# Patient Record
Sex: Male | Born: 1935 | Race: White | Hispanic: No | Marital: Married
Health system: Southern US, Community
[De-identification: ages and names within clinical notes are randomized; demographics above are authoritative.]

## PROBLEM LIST (undated history)

## (undated) DIAGNOSIS — I10 Essential (primary) hypertension: Secondary | ICD-10-CM

## (undated) DIAGNOSIS — I4891 Unspecified atrial fibrillation: Secondary | ICD-10-CM

## (undated) DIAGNOSIS — I509 Heart failure, unspecified: Secondary | ICD-10-CM

## (undated) DIAGNOSIS — R296 Repeated falls: Secondary | ICD-10-CM

## (undated) HISTORY — PX: PACEMAKER PLACEMENT: SHX43

---

## 2003-05-29 ENCOUNTER — Encounter: Admission: RE | Admit: 2003-05-29 | Discharge: 2003-05-29 | Payer: Self-pay | Admitting: Orthopaedic Surgery

## 2004-03-18 ENCOUNTER — Ambulatory Visit: Payer: Self-pay | Admitting: Family Medicine

## 2004-04-06 ENCOUNTER — Inpatient Hospital Stay (HOSPITAL_COMMUNITY): Admission: AD | Admit: 2004-04-06 | Discharge: 2004-04-07 | Payer: Self-pay | Admitting: Orthopaedic Surgery

## 2004-05-09 ENCOUNTER — Ambulatory Visit: Payer: Self-pay | Admitting: Family Medicine

## 2004-05-16 ENCOUNTER — Ambulatory Visit: Payer: Self-pay | Admitting: Family Medicine

## 2004-11-23 ENCOUNTER — Ambulatory Visit: Payer: Self-pay | Admitting: Family Medicine

## 2004-12-09 ENCOUNTER — Ambulatory Visit: Payer: Self-pay | Admitting: Family Medicine

## 2005-01-04 ENCOUNTER — Ambulatory Visit: Payer: Self-pay | Admitting: Family Medicine

## 2005-02-28 ENCOUNTER — Ambulatory Visit: Payer: Self-pay | Admitting: Family Medicine

## 2005-03-06 ENCOUNTER — Ambulatory Visit: Payer: Self-pay | Admitting: Family Medicine

## 2005-03-14 ENCOUNTER — Ambulatory Visit: Payer: Self-pay | Admitting: Family Medicine

## 2005-12-24 IMAGING — CR DG CERVICAL SPINE 2 OR 3 VIEWS
2 series · 2 of 2 positions shown · non-contrast
Comparison: none

CLINICAL DATA: Post-surgical fusion.  
 CERVICAL SPINE ? 2 VIEW:
 The patient has had anterior cervical discectomy and fusion from C-5 to C-7.  Interbody fusion plugs are in place. There is an anterior plate with screw fixation.  No radiographically detectible complication.  There is a drain in place in the left anterior neck.

[view not recorded (1 of 2)]
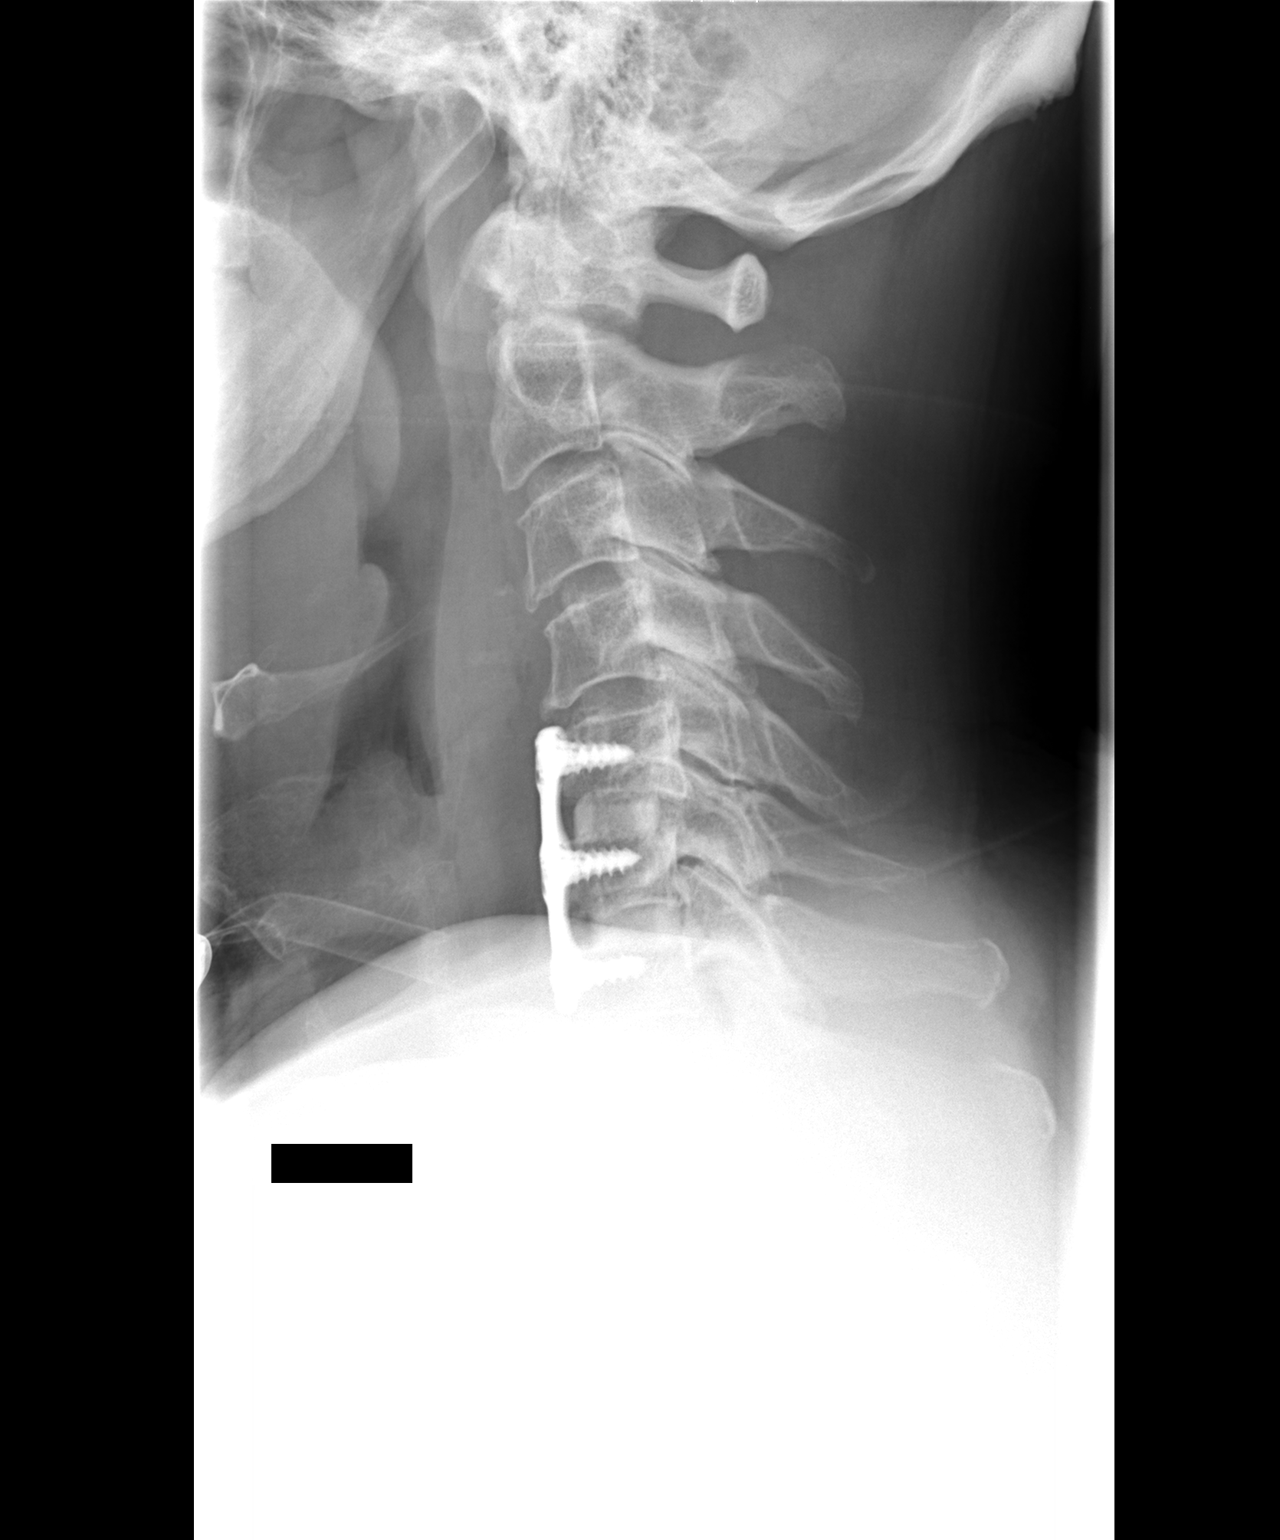

[view not recorded (2 of 2)]
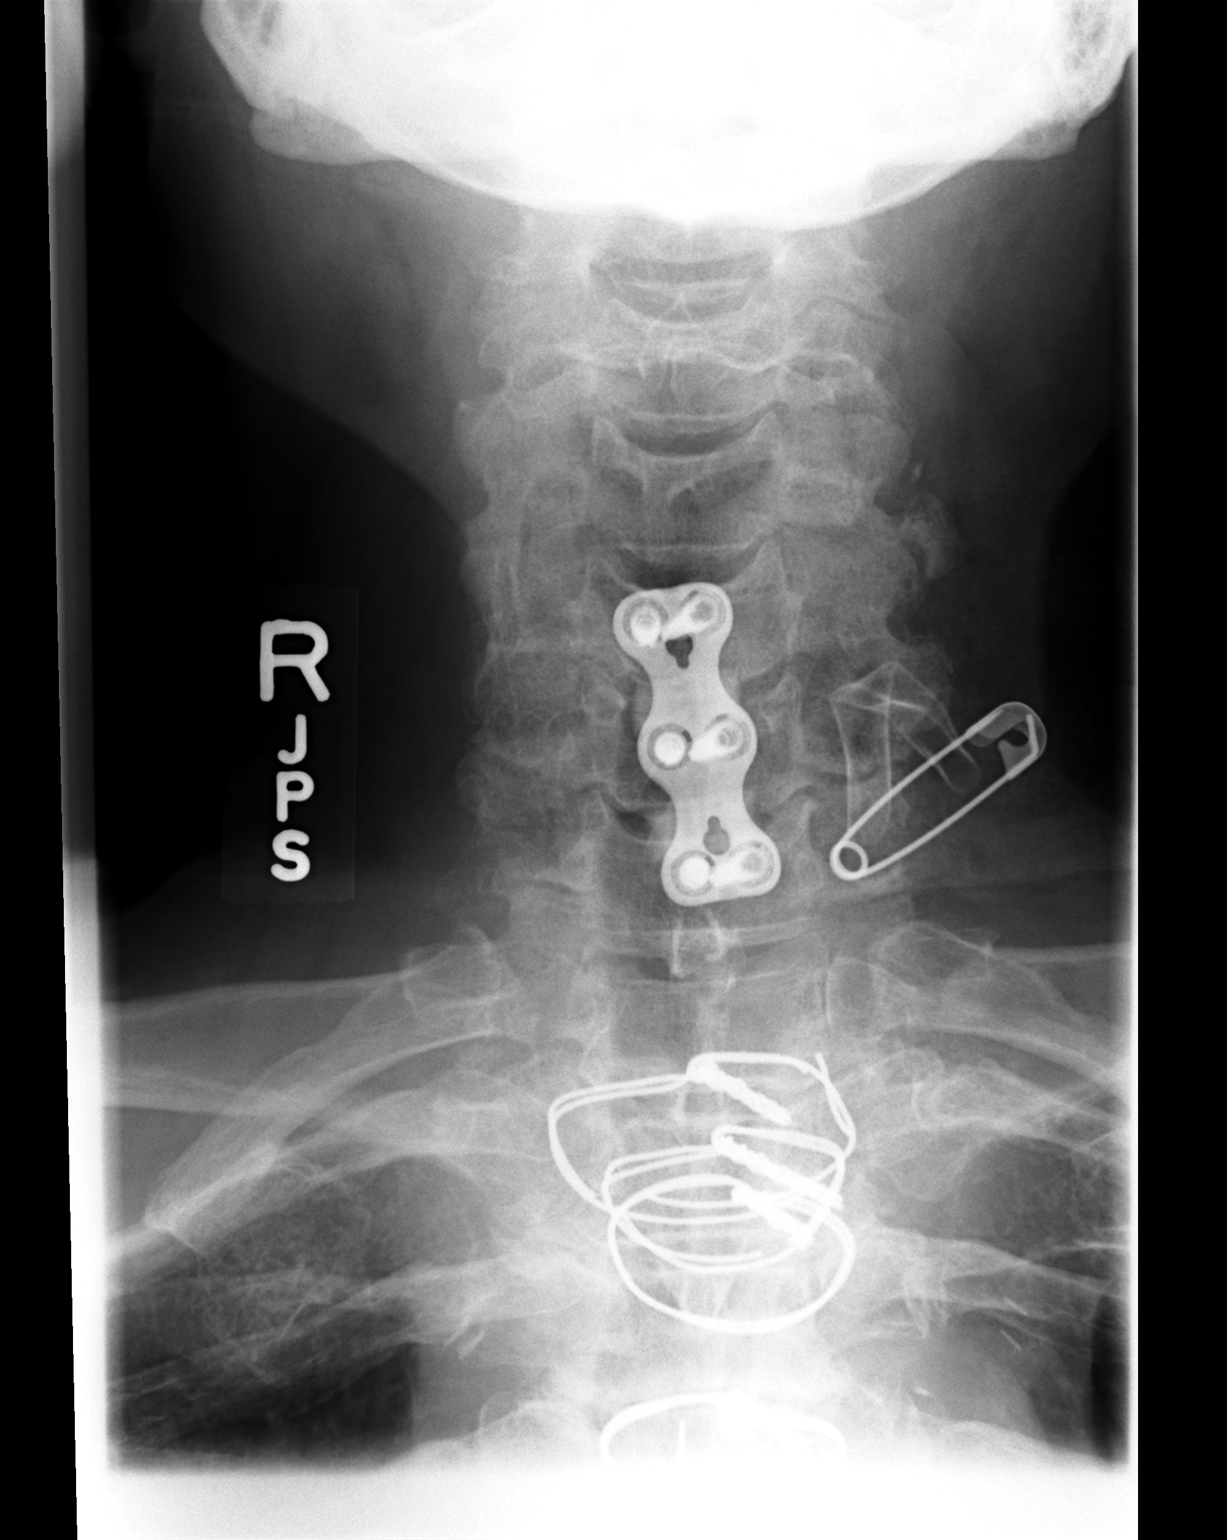

[2 of 2 positions shown; findings below may reference images not displayed]

IMPRESSION: ACDF C-5 to C-7.

## 2010-02-20 ENCOUNTER — Encounter: Payer: Self-pay | Admitting: Orthopaedic Surgery

## 2017-05-03 DIAGNOSIS — Z01818 Encounter for other preprocedural examination: Secondary | ICD-10-CM

## 2017-07-05 ENCOUNTER — Observation Stay (HOSPITAL_COMMUNITY)
Admission: EM | Admit: 2017-07-05 | Discharge: 2017-07-06 | Disposition: A | Discharge status: Home / Self Care | Payer: Medicare HMO | Attending: Surgery | Admitting: Surgery

## 2017-07-05 ENCOUNTER — Encounter (HOSPITAL_COMMUNITY): Payer: Self-pay | Admitting: Emergency Medicine

## 2017-07-05 ENCOUNTER — Other Ambulatory Visit: Payer: Self-pay

## 2017-07-05 DIAGNOSIS — Z9181 History of falling: Secondary | ICD-10-CM | POA: Insufficient documentation

## 2017-07-05 DIAGNOSIS — Z951 Presence of aortocoronary bypass graft: Secondary | ICD-10-CM | POA: Insufficient documentation

## 2017-07-05 DIAGNOSIS — W19XXXA Unspecified fall, initial encounter: Secondary | ICD-10-CM | POA: Diagnosis present

## 2017-07-05 DIAGNOSIS — I10 Essential (primary) hypertension: Secondary | ICD-10-CM | POA: Insufficient documentation

## 2017-07-05 DIAGNOSIS — S32029A Unspecified fracture of second lumbar vertebra, initial encounter for closed fracture: Principal | ICD-10-CM | POA: Insufficient documentation

## 2017-07-05 DIAGNOSIS — R001 Bradycardia, unspecified: Secondary | ICD-10-CM | POA: Insufficient documentation

## 2017-07-05 DIAGNOSIS — I2510 Atherosclerotic heart disease of native coronary artery without angina pectoris: Secondary | ICD-10-CM | POA: Diagnosis not present

## 2017-07-05 DIAGNOSIS — R262 Difficulty in walking, not elsewhere classified: Secondary | ICD-10-CM | POA: Diagnosis not present

## 2017-07-05 DIAGNOSIS — M545 Low back pain: Secondary | ICD-10-CM | POA: Diagnosis not present

## 2017-07-05 DIAGNOSIS — Z79899 Other long term (current) drug therapy: Secondary | ICD-10-CM | POA: Diagnosis not present

## 2017-07-05 DIAGNOSIS — Y92009 Unspecified place in unspecified non-institutional (private) residence as the place of occurrence of the external cause: Secondary | ICD-10-CM | POA: Diagnosis not present

## 2017-07-05 DIAGNOSIS — E7800 Pure hypercholesterolemia, unspecified: Secondary | ICD-10-CM | POA: Diagnosis not present

## 2017-07-05 DIAGNOSIS — R109 Unspecified abdominal pain: Secondary | ICD-10-CM | POA: Diagnosis not present

## 2017-07-05 HISTORY — DX: Essential (primary) hypertension: I10

## 2017-07-05 LAB — CBC
HEMATOCRIT: 39.7 % (ref 39.0–52.0)
HEMOGLOBIN: 13.4 g/dL (ref 13.0–17.0)
MCH: 30.5 pg (ref 26.0–34.0)
MCHC: 33.8 g/dL (ref 30.0–36.0)
MCV: 90.2 fL (ref 78.0–100.0)
Platelets: 183 10*3/uL (ref 150–400)
RBC: 4.4 MIL/uL (ref 4.22–5.81)
RDW: 13.8 % (ref 11.5–15.5)
WBC: 11 10*3/uL — ABNORMAL HIGH (ref 4.0–10.5)

## 2017-07-05 LAB — COMPREHENSIVE METABOLIC PANEL
ALBUMIN: 3.3 g/dL — AB (ref 3.5–5.0)
ALT: 57 U/L (ref 17–63)
AST: 103 U/L — AB (ref 15–41)
Alkaline Phosphatase: 94 U/L (ref 38–126)
Anion gap: 7 (ref 5–15)
BILIRUBIN TOTAL: 1.6 mg/dL — AB (ref 0.3–1.2)
BUN: 12 mg/dL (ref 6–20)
CO2: 26 mmol/L (ref 22–32)
CREATININE: 1.15 mg/dL (ref 0.61–1.24)
Calcium: 9 mg/dL (ref 8.9–10.3)
Chloride: 105 mmol/L (ref 101–111)
GFR calc Af Amer: >60 mL/min (ref >60)
GFR, EST NON AFRICAN AMERICAN: 57 mL/min — AB (ref >60)
GLUCOSE: 125 mg/dL — AB (ref 65–99)
POTASSIUM: 4.1 mmol/L (ref 3.5–5.1)
Sodium: 138 mmol/L (ref 135–145)
TOTAL PROTEIN: 5.7 g/dL — AB (ref 6.5–8.1)

## 2017-07-05 LAB — APTT: aPTT: 29 seconds (ref 24–36)

## 2017-07-05 LAB — PROTIME-INR
INR: 1.08
Prothrombin Time: 13.9 seconds (ref 11.4–15.2)

## 2017-07-05 LAB — MRSA PCR SCREENING: MRSA by PCR: NEGATIVE

## 2017-07-05 MED ORDER — HYDROMORPHONE HCL 1 MG/ML IJ SOLN
1.0000 mg | INTRAMUSCULAR | Status: DC | PRN
  Administered 2017-07-05: 1 mg via INTRAVENOUS
  Filled 2017-07-05 (×2): qty 1

## 2017-07-05 MED ORDER — ENOXAPARIN SODIUM 40 MG/0.4ML ~~LOC~~ SOLN
40.0000 mg | SUBCUTANEOUS | Status: DC
  Administered 2017-07-06: 40 mg via SUBCUTANEOUS
  Filled 2017-07-05: qty 0.4

## 2017-07-05 MED ORDER — FAMOTIDINE IN NACL 20-0.9 MG/50ML-% IV SOLN
20.0000 mg | Freq: Two times a day (BID) | INTRAVENOUS | Status: DC
  Administered 2017-07-05: 20 mg via INTRAVENOUS
  Filled 2017-07-05: qty 50

## 2017-07-05 MED ORDER — HYDROMORPHONE HCL 2 MG/ML IJ SOLN
1.0000 mg | INTRAMUSCULAR | Status: DC | PRN
Start: 2017-07-05 — End: 2017-07-05
  Administered 2017-07-05: 1 mg via INTRAVENOUS
  Filled 2017-07-05: qty 1

## 2017-07-05 MED ORDER — ENOXAPARIN SODIUM 40 MG/0.4ML ~~LOC~~ SOLN
40.0000 mg | SUBCUTANEOUS | Status: DC
  Filled 2017-07-05: qty 0.4

## 2017-07-05 MED ORDER — METOPROLOL TARTRATE 25 MG PO TABS
25.0000 mg | ORAL_TABLET | Freq: Two times a day (BID) | ORAL | Status: DC
  Administered 2017-07-05 – 2017-07-06 (×2): 25 mg via ORAL
  Filled 2017-07-05 (×3): qty 1

## 2017-07-05 MED ORDER — PANTOPRAZOLE SODIUM 40 MG PO TBEC
40.0000 mg | DELAYED_RELEASE_TABLET | Freq: Every day | ORAL | Status: DC
  Administered 2017-07-05 – 2017-07-06 (×2): 40 mg via ORAL
  Filled 2017-07-05 (×2): qty 1

## 2017-07-05 MED ORDER — TRAMADOL HCL 50 MG PO TABS
50.0000 mg | ORAL_TABLET | Freq: Four times a day (QID) | ORAL | Status: DC
  Administered 2017-07-05 – 2017-07-06 (×4): 50 mg via ORAL
  Filled 2017-07-05 (×4): qty 1

## 2017-07-05 MED ORDER — ONDANSETRON 4 MG PO TBDP
4.0000 mg | ORAL_TABLET | Freq: Four times a day (QID) | ORAL | Status: DC | PRN
Start: 2017-07-05 — End: 2017-07-06

## 2017-07-05 MED ORDER — FAMOTIDINE 20 MG PO TABS: 20.0000 mg | ORAL_TABLET | Freq: Two times a day (BID) | ORAL | Status: DC

## 2017-07-05 MED ORDER — METOPROLOL TARTRATE 5 MG/5ML IV SOLN: 5.0000 mg | Freq: Four times a day (QID) | INTRAVENOUS | Status: DC | PRN

## 2017-07-05 MED ORDER — DEXTROSE-NACL 5-0.9 % IV SOLN
INTRAVENOUS | Status: DC
  Administered 2017-07-05 – 2017-07-06 (×4): via INTRAVENOUS

## 2017-07-05 MED ORDER — ACETAMINOPHEN 500 MG PO TABS
1000.0000 mg | ORAL_TABLET | Freq: Three times a day (TID) | ORAL | Status: DC
Start: 2017-07-05 — End: 2017-07-06
  Administered 2017-07-05 – 2017-07-06 (×4): 1000 mg via ORAL
  Filled 2017-07-05 (×4): qty 2

## 2017-07-05 MED ORDER — ONDANSETRON HCL 4 MG/2ML IJ SOLN
4.0000 mg | Freq: Four times a day (QID) | INTRAMUSCULAR | Status: DC | PRN
  Administered 2017-07-05: 4 mg via INTRAVENOUS
  Filled 2017-07-05: qty 2

## 2017-07-05 MED ORDER — METHOCARBAMOL 500 MG PO TABS
750.0000 mg | ORAL_TABLET | Freq: Three times a day (TID) | ORAL | Status: DC
  Administered 2017-07-05 – 2017-07-06 (×5): 750 mg via ORAL
  Filled 2017-07-05 (×5): qty 2

## 2017-07-05 MED ORDER — OXYCODONE HCL 5 MG PO TABS
5.0000 mg | ORAL_TABLET | ORAL | Status: DC | PRN
  Administered 2017-07-06: 5 mg via ORAL
  Filled 2017-07-05: qty 1

## 2017-07-05 MED ORDER — TRAMADOL HCL 50 MG PO TABS
100.0000 mg | ORAL_TABLET | Freq: Four times a day (QID) | ORAL | Status: DC
Start: 2017-07-05 — End: 2017-07-05
  Administered 2017-07-05: 100 mg via ORAL
  Filled 2017-07-05: qty 2

## 2017-07-05 NOTE — Progress Notes (Signed)
CC:  Fall with abdominal and back pain  Subjective: Pt fell at home last evening and [facility] with L2 fracture and hematoma.  He also complains of some abdominal pain, but CT reviewed here by our radiologist and Dr. Luisa Hartornett find no acute issues.  He has some GERD sx at home.  Mostly sore all over this AM.   Objective: Vital signs in last 24 hours: Pulse Rate:  [53-64] 53 (06/06 0630) Resp:  [10-22] 10 (06/06 0630) BP: (102-157)/(51-71) 105/65 (06/06 0630) SpO2:  [90 %-100 %] 90 % (06/06 0630) Weight:  [103 kg (227 lb)] 103 kg (227 lb) (06/06 0028)   NO I/O so far Afebrile, BP is low normal, HR 50's - 60's WBC 11K remainder of CBC OK Glucose 125, T Bil up 1.6, AST 103, low albumin/protein [facility] reviewed by Dr. Luisa Hartornett with radiology, reports not available in Epic at this time.   Intake/Output from previous day: No intake/output data recorded. Intake/Output this shift: No intake/output data recorded.  General appearance: alert, cooperative, no distress and complains of back pain and some abdominal pain. Resp: clear to auscultation bilaterally Chest wall: no tenderness Cardio: regular rhythm, bradycardic - on BB GI: soft, he complains of some tenderness all over, no peritonitis, + BS, last BM yesterday, no distension. Extremities: extremities normal, atraumatic, no cyanosis or edema and he wears a copper compression sleeve over the left knee..  he has neuropathy both feet ongoing for a couple years Pulses: 2+ and symmetric Neurologic: Grossly normal  Lab Results:  Recent Labs    07/05/17 0413  WBC 11.0*  HGB 13.4  HCT 39.7  PLT 183    BMET Recent Labs    07/05/17 0413  NA 138  K 4.1  CL 105  CO2 26  GLUCOSE 125*  BUN 12  CREATININE 1.15  CALCIUM 9.0   PT/INR No results for input(s): LABPROT, INR in the last 72 hours.  Recent Labs  Lab 07/05/17 0413  AST 103*  ALT 57  ALKPHOS 94  BILITOT 1.6*  PROT 5.7*   ALBUMIN 3.3*     Lipase  No results found for: LIPASE   Prior to Admission medications   Medication Sig Start Date End Date Taking? Authorizing Provider  ALPRAZolam (XANAX) 0.25 MG tablet Take 0.25 mg by mouth daily as needed. 05/18/17  Yes [provider]  escitalopram (LEXAPRO) 10 MG tablet Take 10 mg by mouth daily. 01/17/16  Yes [provider]  metoprolol tartrate (LOPRESSOR) 100 MG tablet Take 50 mg by mouth 2 (two) times daily. 05/29/17  Yes [provider]  Multiple Vitamin (MULTIVITAMIN WITH MINERALS) TABS tablet Take 1 tablet by mouth daily.   Yes [provider]  nitroGLYCERIN (NITROSTAT) 0.4 MG SL tablet Place 0.4 mg under the tongue every 5 (five) minutes as needed for chest pain. 06/27/17  Yes [provider]  Propylene Glycol (SYSTANE BALANCE OP) Apply 1 drop to eye as needed (for dry eyes).   Yes [provider]  simvastatin (ZOCOR) 80 MG tablet Take 40 mg by mouth every evening. 05/29/17  Yes [provider]  traMADol (ULTRAM) 50 MG tablet Take 50 mg by mouth daily. 02/24/16  Yes [provider]    Medications: . enoxaparin (LOVENOX) injection  40 mg Subcutaneous Q24H  . pantoprazole  40 mg Oral Daily   . dextrose 5 % and 0.9% NaCl 75 mL/hr at 07/05/17 0136    Assessment/Plan CAD -  CABG 2001 Hypertension Hx of multiple lumbar fusions Hypercholesterolemia Neuropathy secondary to prior back surgeries GERD Occasional Chest pain/GERD - Tums and nitro both resolve issues ?AF - EKG here is reading AF, I think he has P-waves at least in V1, will recheck later on floor. No hx of AF or anticoagulation aside from ASA 81 mg qd - last dose yesterday  Fall over chair Non displaced L2 fracture with small hematoma Aortic changes on CT reviewed - no sign of aortic injury, dissection, or hematoma  FEN:  IV fluids/Clear liquids ID:  None DVT:  Lovenox ordered but does not start till noon Follow up:   TBD  Plan:  I have told him not to eat breakfast till we get neurosurgery to see him.  I will put him back on his BB at a lower dose with sips of water till seen by  Neurosurgery, recheck EKG later on the floor, and if he is in AF contact Cardiology.  He has no hx of AF or anticoagulation.        LOS: 0 days    Parry Po 07/05/2017 [phone]

## 2017-07-05 NOTE — H&P (Signed)
Mitchell Weeks is an 84 y.o. male.   Chief Complaint: Fall over chair HPI: Patient transferred from [facility] after the patient got up and fell over a chair at home.  He presented complaining of low back pain after the fall and some mid abdominal pain.  He denies nausea or vomiting.  He has a history of multiple lumbar spine surgeries for fusions over 10 years ago.  He has some baseline numbness of both feet.  He was evaluated at [facility] and found to have an L2 fracture which was nondisplaced.  His neurological examination was stable.  He also had some changes on CT scan felt to be related to his aorta.  I reviewed the films with the radiologist here at [facility] these changes are quite minimal.  There is no evidence of rupture, dissection or significant hematoma.  There is a small hematoma associate with L2.  Since they have no spine surgeons available they [facility].  Currently, he is complaining of some low back pain.  He denies any significant change to strength to his lower extremities and has baseline numbness present which was present before the fall.  He has mild epigastric abdominal pain currently.  He denies any headache, neck pain, chest pain, or shortness of breath.  He has a history of a CABG in 2001 and takes medication for hypertension and high cholesterol.  His images were not transferred with him.  I reviewed [facility] room.  No past medical history on file. Coronary artery disease  Multiple lumbar fusions  Hypercholesterolemia  Hypertension   No family history on file. Social History:  has no tobacco, alcohol, and drug history on file.  Allergies: Allergies not on file    No results found for this or any previous visit (from the past 48 hour(s)). No results found.  Review of Systems  Constitutional: Negative for chills and fever.  HENT: Negative for hearing loss and tinnitus.   Eyes: Negative for blurred  vision and double vision.  Respiratory: Negative for cough and hemoptysis.   Cardiovascular: Negative for chest pain and palpitations.  Gastrointestinal: Positive for abdominal pain. Negative for nausea and vomiting.  Genitourinary: Negative for dysuria and urgency.  Musculoskeletal: Positive for back pain. Negative for myalgias and neck pain.  Skin: Negative for rash.  Neurological: Negative for dizziness and headaches.  Endo/Heme/Allergies: Does not bruise/bleed easily.  Psychiatric/Behavioral: Negative for depression and suicidal ideas.    Blood pressure (!) 146/71, pulse 64, resp. rate 15, height 5\' 11"  (1.803 m), weight 103 kg (227 lb), SpO2 100 %. Physical Exam  Constitutional: He is oriented to person, place, and time. He appears well-developed and well-nourished.  HENT:  Head: Normocephalic and atraumatic.  Eyes: Pupils are equal, round, and reactive to light.  Neck: Normal range of motion. Neck supple.  Nontender  Cardiovascular: Normal rate and regular rhythm.  Respiratory: Effort normal and breath sounds normal. No respiratory distress.  GI: Soft. He exhibits no mass. There is tenderness. There is no rebound and no guarding.  Genitourinary: Penis normal.  Musculoskeletal:       Lumbar back: He exhibits tenderness.  Neurological: He is alert and oriented to person, place, and time. He has normal strength. No cranial nerve deficit or sensory deficit. GCS eye subscore is 4. GCS verbal subscore is 5. GCS motor subscore is 6.  Patient has baseline numbness from previous spine surgeries in the past.  He states he is no  different than baseline.  Motor function of bilateral lower extremities normal.  Skin: Skin is warm and dry.     Assessment/Plan Fall  Nondisplaced L2 fracture-neurologically intact.  Will obtain neurosurgery consultation later on this morning.  Admit and place on logroll only for now.  Aortic changes on CT-I reviewed these with the radiologist here at [facility].  These seem quite minimal.  Repeat CT scan in 24 to 48 hours to reevaluate but currently no signs of any aortic injury, dissection, or significant hematoma.  He does have atherosclerotic disease.  [facility] spoke to the vascular surgeon on-call Dr. Durene Cal III.  Dortha Schwalbe, MD 07/05/2017, 12:48 AM

## 2017-07-05 NOTE — Consult Note (Signed)
[facility]ist of [place]  Patient name: Mitchell Weeks MRN: [id number] DOB: 1932-03-14 Sex: male   REQUESTING PROVIDER:    [facility]   REASON FOR CONSULT:    ? Aortic injury  HISTORY OF PRESENT ILLNESS:   Mitchell Weeks is a 84 y.o. male, who presented to the [facility] emergency department after he fell over a chair at home.  He was complaining of low back pain as well as some mild abdominal pain.  He had a CT scan which suggested a possible aortic injury.  He also had a nondisplaced L2 fracture.  He was transferred to [facility] for further evaluation.  He has remained hemodynamically stable and neurologically intact.  His pain is about the same as mostly in his back.  Patient is status post CABG in 2001.  He is medically managed for hypertension hypercholesterolemia.  PAST MEDICAL HISTORY    Past Medical History:  Diagnosis Date  . Hypertension      FAMILY HISTORY   No family history on file.  SOCIAL HISTORY:   Social History   Socioeconomic History  . Marital status: Married    Spouse name: Not on file  . Number of children: Not on file  . Years of education: Not on file  . Highest education level: Not on file  Occupational History  . Not on file  Social Needs  . Financial resource strain: Not on file  . Food insecurity:    Worry: Not on file    Inability: Not on file  . Transportation needs:    Medical: Not on file    Non-medical: Not on file  Tobacco Use  . Smoking status: Not on file  Substance and Sexual Activity  . Alcohol use: Not on file  . Drug use: Not on file  . Sexual activity: Not on file  Lifestyle  . Physical activity:    Days per week: Not on file    Minutes per session: Not on file  . Stress: Not on file  Relationships  . Social connections:    Talks on phone: Not on file    Gets together: Not on file    Attends religious service: Not on file    Active member of club or  organization: Not on file    Attends meetings of clubs or organizations: Not on file    Relationship status: Not on file  . Intimate partner violence:    Fear of current or ex partner: Not on file    Emotionally abused: Not on file    Physically abused: Not on file    Forced sexual activity: Not on file  Other Topics Concern  . Not on file  Social History Narrative  . Not on file    ALLERGIES:    Allergies  Allergen Reactions  . Niacin Other (See Comments)    Flushing (ALLERGY/intolerance)    CURRENT MEDICATIONS:    Current Facility-Administered Medications  Medication Dose Route Frequency Provider Last Rate Last Dose  . acetaminophen (TYLENOL) tablet 1,000 mg  1,000 mg Oral Q8H Sherrie George, PA-C      . dextrose 5 %-0.9 % sodium chloride infusion   Intravenous Continuous Harriette Bouillon, MD 75 mL/hr at 07/05/17 0136    . [START ON 07/06/2017] enoxaparin (LOVENOX) injection 40 mg  40 mg Subcutaneous Q24H Almond Lint, MD      . famotidine (PEPCID) tablet 20 mg  20 mg Oral BID Sherrie George, PA-C      .  HYDROmorphone (DILAUDID) injection 1 mg  1 mg Intravenous Q2H PRN Bertram MillardMaccia, Michael A, RPH      . methocarbamol (ROBAXIN) tablet 750 mg  750 mg Oral TID Sherrie GeorgeJennings, Willard, PA-C      . metoprolol tartrate (LOPRESSOR) tablet 25 mg  25 mg Oral BID Sherrie GeorgeJennings, Willard, PA-C   25 mg at 07/05/17 0900  . ondansetron (ZOFRAN-ODT) disintegrating tablet 4 mg  4 mg Oral Q6H PRN Cornett, Thomas, MD       Or  . ondansetron (ZOFRAN) injection 4 mg  4 mg Intravenous Q6H PRN Harriette Bouillonornett, Thomas, MD   4 mg at 07/05/17 0129  . oxyCODONE (Oxy IR/ROXICODONE) immediate release tablet 5-10 mg  5-10 mg Oral Q4H PRN Sherrie GeorgeJennings, Willard, PA-C      . pantoprazole (PROTONIX) EC tablet 40 mg  40 mg Oral Daily Cornett, Thomas, MD   40 mg at 07/05/17 0900  . traMADol (ULTRAM) tablet 100 mg  100 mg Oral Q6H Sherrie GeorgeJennings, Willard, PA-C        REVIEW OF SYSTEMS:   [X]  denotes positive finding, [ ]  denotes  negative finding Cardiac  Comments:  Chest pain or chest pressure:    Shortness of breath upon exertion:    Short of breath when lying flat:    Irregular heart rhythm:        Vascular    Pain in calf, thigh, or hip brought on by ambulation:    Pain in feet at night that wakes you up from your sleep:     Blood clot in your veins:    Leg swelling:         Pulmonary    Oxygen at home:    Productive cough:     Wheezing:         Neurologic    Sudden weakness in arms or legs:     Sudden numbness in arms or legs:     Sudden onset of difficulty speaking or slurred speech:    Temporary loss of vision in one eye:     Problems with dizziness:         Gastrointestinal    Blood in stool:      Vomited blood:         Genitourinary    Burning when urinating:     Blood in urine:        Psychiatric    Major depression:         Hematologic    Bleeding problems:    Problems with blood clotting too easily:        Skin    Rashes or ulcers:        Constitutional    Fever or chills:     PHYSICAL EXAM:   Vitals:   07/05/17 0715 07/05/17 0730 07/05/17 0745 07/05/17 0900  BP: 114/60 112/72 126/89 119/61  Pulse: (!) 29 (!) 56 64 62  Resp: 10 11 15 16   SpO2: 93% 93% 97% 96%  Weight:      Height:        GENERAL: The patient is a well-nourished male, in no acute distress. The vital signs are documented above. CARDIAC: There is a regular rate and rhythm.  PULMONARY: Nonlabored respirations ABDOMEN: Soft and mildly tender.  MUSCULOSKELETAL: There are no major deformities or cyanosis. NEUROLOGIC: No focal weakness or paresthesias are detected. SKIN: There are no ulcers or rashes noted. PSYCHIATRIC: The patient has a normal affect.  STUDIES:   I have reviewed his CT scan which  showed some periaortic blood.  It is unknown wheather this is from his nondisplaced L2 fracture or truly injury to the aorta  ASSESSMENT and PLAN    possible aortic injury: My suspicion is very low that  this Is a significant aortic injury however, I think he needs to be monitored.  Continue with blood pressure control and plans for repeat CT scan tomorrow.    Durene Cal, MD [facility] [phone] Pager [phone]

## 2017-07-05 NOTE — ED Notes (Signed)
Report given to [address].

## 2017-07-05 NOTE — ED Notes (Signed)
Neuro surgery at bedside.

## 2017-07-05 NOTE — Progress Notes (Signed)
Patient NPO  Lunch ordered for patient spouse

## 2017-07-05 NOTE — ED Triage Notes (Signed)
Pt from home. Transfer from [facility]. Fall @ approximately 4:30 or 5p. Hit head. Rates pain @ 10/10. Per [facility] pt has an L2 fx and aortic hematoma. PMS intact. A&O x4.

## 2017-07-05 NOTE — Progress Notes (Signed)
Orthopedic Tech Progress Note Patient Details:  Mitchell Weeks 1932/10/27 [id number]  Patient ID: Mitchell Weeks, male   DOB: 1932/10/27, 84 y.o.   MRN: [id number]   Mitchell HollerJennifer C KoontzCalled [employer] for Clamshell TLSO brace. 07/05/2017, 10:10 AM

## 2017-07-05 NOTE — ED Notes (Signed)
Surgery at bedside.

## 2017-07-05 NOTE — ED Notes (Signed)
Dr. Luisa Hartornett of trauma aware patient has arrived

## 2017-07-05 NOTE — Evaluation (Signed)
Occupational Therapy Evaluation Patient Details Name: Mitchell Weeks MRN: [id number] DOB: 1932/07/01 Today's Date: 07/05/2017    History of Present Illness The patient is an 84 year old white male with multiple medical problems including legal blindness, Patient is status post CABG in 2001.  He is medically managed for hypertension hypercholesterolemia.  He has had several spine surgeries by Dr. Lucius Conn, and orthopedic surgeon in [place].  His most recent surgery was approximately 4 months ago. The patient took a fall yesterday onset of  back pain. CT of the abdomen pelvis demonstrated a L2 fracture.  He has chronic numbness in his feet. Vascular surgery is also following monitoring his aorta for bleeding.     Clinical Impression   PTA Pt got assist from wife for LB dressing tasks, otherwise independent in ADL, used a SPC for mobility - but with history of falls. Pt is currently min A +2 for transfers with RW (anticipate +1 by tomorrow), max A to don brace (education provided), max A for LB dressing, and min guard for sink level ADL. Pt will benefit from skilled OT in the acute setting with tomorrow's focus on AE education, continued brace education, and tub transfer    Follow Up Recommendations  Supervision/Assistance - 24 hour    Equipment Recommendations  Tub/shower seat    Recommendations for Other Services       Precautions / Restrictions Precautions Precautions: Back Precaution Comments: verbally reviewed with pt and wife.  Remembered from previous surgery.  Required Braces or Orthoses: Spinal Brace Spinal Brace: Thoracolumbosacral orthotic;Applied in sitting position      Mobility Bed Mobility Overal bed mobility: Needs Assistance Bed Mobility: Sit to Sidelying;Rolling;Sidelying to Sit Rolling: Min guard Sidelying to sit: Min guard     Sit to sidelying: Min guard General bed mobility comments: Min guard assist to ensure back precautions and task  completion  Transfers Overall transfer level: Needs assistance Equipment used: Rolling walker (2 wheeled) Transfers: Sit to/from Stand Sit to Stand: Min assist;+2 safety/equipment         General transfer comment: Min assist to support trunk during transitions.  Verbal cues for safe hand placement and safe RW use during transitions as well as to ensure he feels the bed on the back of his legs prior to sitting down.     Balance Overall balance assessment: Needs assistance Sitting-balance support: Feet supported;Bilateral upper extremity supported Sitting balance-Leahy Scale: Fair     Standing balance support: Bilateral upper extremity supported Standing balance-Leahy Scale: Poor                             ADL either performed or assessed with clinical judgement   ADL Overall ADL's : Needs assistance/impaired Eating/Feeding: Independent   Grooming: Min guard;Standing   Upper Body Bathing: Moderate assistance   Lower Body Bathing: Maximal assistance;With caregiver independent assisting   Upper Body Dressing : Maximal assistance;Sitting Upper Body Dressing Details (indicate cue type and reason): to don brace - education complete Lower Body Dressing: Maximal assistance;With caregiver independent assisting;Sit to/from stand   Toilet Transfer: Min guard;Ambulation;RW;Comfort height toilet;Grab bars   Toileting- Clothing Manipulation and Hygiene: Maximal assistance;Sit to/from stand       Functional mobility during ADLs: Min guard;Rolling walker General ADL Comments: Pt educated in BLT back precautions - NO AE education this session     Vision Baseline Vision/History: Legally blind;Glaucoma Patient Visual Report: No change from baseline Additional Comments: legally blind  Perception     Praxis      Pertinent Vitals/Pain Pain Assessment: 0-10 Pain Score: 8  Pain Location: lumber pain Pain Descriptors / Indicators: Constant;Discomfort;Grimacing Pain  Intervention(s): Limited activity within patient's tolerance;Monitored during session;Repositioned     Hand Dominance Right   Extremity/Trunk Assessment Upper Extremity Assessment Upper Extremity Assessment: Overall WFL for tasks assessed   Lower Extremity Assessment Lower Extremity Assessment: Defer to PT evaluation RLE Deficits / Details: generalized weakness, no new numbness, tingling or burning.  RLE Sensation: history of peripheral neuropathy LLE Deficits / Details: generalized weakness, no new numbness, tingling or burning.  LLE Sensation: history of peripheral neuropathy   Cervical / Trunk Assessment Cervical / Trunk Assessment: Other exceptions Cervical / Trunk Exceptions: h/o lumbar spine surgery.   Communication Communication Communication: No difficulties   Cognition Arousal/Alertness: Awake/alert Behavior During Therapy: WFL for tasks assessed/performed Overall Cognitive Status: Within Functional Limits for tasks assessed                                     General Comments  wife present throughout session    Exercises     Shoulder Instructions      Home Living Family/patient expects to be discharged to:: Private residence Living Arrangements: Spouse/significant other Available Help at Discharge: Family;Available 24 hours/day Type of Home: Apartment Home Access: Stairs to enter Entrance Stairs-Number of Steps: 5 Entrance Stairs-Rails: Can reach both;Right;Left Home Layout: One level     Bathroom Shower/Tub: Chief Strategy OfficerTub/shower unit   Bathroom Toilet: Handicapped height Bathroom Accessibility: No   Home Equipment: Cane - single point;Walker - 4 wheels;Grab bars - tub/shower          Prior Functioning/Environment Level of Independence: Needs assistance  Gait / Transfers Assistance Needed: SPC for ambulation ADL's / Homemaking Assistance Needed: assist for LB dressing            OT Problem List: Decreased activity tolerance;Impaired  balance (sitting and/or standing);Decreased knowledge of use of DME or AE;Decreased knowledge of precautions;Cardiopulmonary status limiting activity;Pain      OT Treatment/Interventions: Self-care/ADL training;DME and/or AE instruction;Therapeutic activities;Patient/family education;Balance training    OT Goals(Current goals can be found in the care plan section) Acute Rehab OT Goals Patient Stated Goal: to stop falling so much OT Goal Formulation: With patient Time For Goal Achievement: 07/19/17 Potential to Achieve Goals: Good ADL Goals Pt Will Transfer to Toilet: with supervision;ambulating Pt Will Perform Tub/Shower Transfer: Tub transfer;with min guard assist;with caregiver independent in assisting;anterior/posterior transfer;rolling walker Additional ADL Goal #1: Pt will verbalize understanding of AE to assist with LB ADL at teach back level Additional ADL Goal #2: Pt will don/doff brace with assist from caregiver at supervision level  OT Frequency: Min 2X/week   Barriers to D/C:            Co-evaluation PT/OT/SLP Co-Evaluation/Treatment: Yes Reason for Co-Treatment: Other (comment)(due to imminent dc order) PT goals addressed during session: Mobility/safety with mobility OT goals addressed during session: ADL's and self-care;Proper use of Adaptive equipment and DME      AM-PAC PT "6 Clicks" Daily Activity     Outcome Measure Help from another person eating meals?: None Help from another person taking care of personal grooming?: A Little Help from another person toileting, which includes using toliet, bedpan, or urinal?: A Lot Help from another person bathing (including washing, rinsing, drying)?: A Lot Help from another person to put on and  taking off regular upper body clothing?: A Lot Help from another person to put on and taking off regular lower body clothing?: A Lot 6 Click Score: 15   End of Session Equipment Utilized During Treatment: Rolling walker;Back  brace Nurse Communication: Mobility status;Precautions  Activity Tolerance: Patient tolerated treatment well Patient left: in bed;with call bell/phone within reach;with family/visitor present  OT Visit Diagnosis: Unsteadiness on feet (R26.81);Other abnormalities of gait and mobility (R26.89);History of falling (Z91.81);Pain Pain - Right/Left: (central) Pain - part of body: (lumbar spine)                Time: 8295-6213 OT Time Calculation (min): 23 min Charges:  OT General Charges $OT Visit: 1 Visit OT Evaluation $OT Eval Moderate Complexity: 1 Mod G-Codes:     Sherryl Manges OTR/L [phone]  Evern Bio Vedanth Sirico 07/05/2017, 5:38 PM

## 2017-07-05 NOTE — Consult Note (Signed)
Reason for Consult: L2 fracture Referring Physician: Trauma surgery  Mitchell Weeks is an 84 y.o. male.  HPI: The patient is an 84 year old white male with multiple medical problems including legal blindness.  He has had several spine surgeries by Dr. Ian Malkin, and orthopedic surgeon in [place].  His most recent surgery was approximately 4 months ago.  The patient took a fall yesterday onset of  back pain.  He was taken to [facility] and a CT of the abdomen pelvis demonstrated a L2 fracture.  Was also some concern about his aorta.  He was transferred to the trauma service at [facility]  A neurosurgical consultation is requested.  Presently the patient is alert and pleasant.  He planes of lumbar spine pain.  He has chronic numbness in his feet which has not changed.  He denies weakness.  He has some chronic mild neck pain which has not changed since his fall.  Past Medical History:  Diagnosis Date  . Hypertension       No family history on file.  Social History:  has no tobacco, alcohol, and drug history on file.  Allergies:  Allergies  Allergen Reactions  . Niacin Other (See Comments)    Flushing (ALLERGY/intolerance)    Medications:  I have reviewed the patient's current medications. Prior to Admission:  (Not in a hospital admission) Scheduled: . [START ON 07/06/2017] enoxaparin (LOVENOX) injection  40 mg Subcutaneous Q24H  . metoprolol tartrate  25 mg Oral BID  . pantoprazole  40 mg Oral Daily   Continuous: . dextrose 5 % and 0.9% NaCl 75 mL/hr at 07/05/17 0136  . famotidine (PEPCID) IV 20 mg (07/05/17 0904)   KXF:GHWEXHBZJIRCV (DILAUDID) injection, ondansetron **OR** ondansetron (ZOFRAN) IV Anti-infectives (From admission, onward)   None       Results for orders placed or performed during the hospital encounter of 07/05/17 (from the past 48 hour(s))  CBC     Status: Abnormal   Collection Time: 07/05/17  4:13 AM  Result Value Ref Range   WBC 11.0  (H) 4.0 - 10.5 K/uL   RBC 4.40 4.22 - 5.81 MIL/uL   Hemoglobin 13.4 13.0 - 17.0 g/dL   HCT 39.7 39.0 - 52.0 %   MCV 90.2 78.0 - 100.0 fL   MCH 30.5 26.0 - 34.0 pg   MCHC 33.8 30.0 - 36.0 g/dL   RDW 13.8 11.5 - 15.5 %   Platelets 183 150 - 400 K/uL    Comment: Performed at [facility], [place] [address] [place], [place] [address]  Comprehensive metabolic panel     Status: Abnormal   Collection Time: 07/05/17  4:13 AM  Result Value Ref Range   Sodium 138 135 - 145 mmol/L   Potassium 4.1 3.5 - 5.1 mmol/L   Chloride 105 101 - 111 mmol/L   CO2 26 22 - 32 mmol/L   Glucose, Bld 125 (H) 65 - 99 mg/dL   BUN 12 6 - 20 mg/dL   Creatinine, Ser 1.15 0.61 - 1.24 mg/dL   Calcium 9.0 8.9 - 10.3 mg/dL   Total Protein 5.7 (L) 6.5 - 8.1 g/dL   Albumin 3.3 (L) 3.5 - 5.0 g/dL   AST 103 (H) 15 - 41 U/L   ALT 57 17 - 63 U/L   Alkaline Phosphatase 94 38 - 126 U/L   Total Bilirubin 1.6 (H) 0.3 - 1.2 mg/dL   GFR calc non Af Amer 57 (L) >60 mL/min   GFR calc  Af Amer >60 >60 mL/min    Comment: (NOTE) The eGFR has been calculated using the CKD EPI equation. This calculation has not been validated in all clinical situations. eGFR's persistently <60 mL/min signify possible Chronic Kidney Disease.    Anion gap 7 5 - 15    Comment: Performed at [facility] [address] [place], [place] [address]  Protime-INR     Status: None   Collection Time: 07/05/17  8:37 AM  Result Value Ref Range   Prothrombin Time 13.9 11.4 - 15.2 seconds   INR 1.08     Comment: Performed at [facility] [address], [place], [place] [address]  APTT     Status: None   Collection Time: 07/05/17  8:37 AM  Result Value Ref Range   aPTT 29 24 - 36 seconds    Comment: Performed at [facility] [address] [place], [place] [address]    No results found.  ROS: As above Blood pressure 119/61, pulse 62, resp. rate 16, height 5' 11"  (1.803 m), weight 103 kg (227 lb), SpO2 96 %. Estimated body  mass index is 31.66 kg/m as calculated from the following:   Height as of this encounter: 5' 11"  (1.803 m).   Weight as of this encounter: 103 kg (227 lb).  Physical Exam  General: An alert and pleasant 84 year old white male in no apparent distress laying on a stretcher.  HEENT: Normocephalic, atraumatic, his pupils are equal and round.  Extraocular muscles are intact  Neck: Supple without obvious deformity.  He has limited cervical range of motion.  Thorax: Symmetric  Abdomen: Obese  Back exam: He is tender at approximately L2.  Neurologic exam: The patient is alert and oriented x3.  Cranial nerves II through XII were grossly normal except as above he is legally blind secondary to macular degeneration.  The patient's motor strength is 5/5 in his bowel bicep, triceps, handgrip, quadriceps, gastrocnemius and dorsiflexors.  Sensory function demonstrates distal chronic numbness in his feet.  Cerebellar function is intact to rapid alternating movements of the upper extremities bilaterally.  Imaging studies: I have reviewed the patient's CT of the abdomen pelvis only as it pertains to his spine.  He has had pedicle screw instrumentation and fusion at L4-5 and L5-S1.  He has diffuse degenerative changes.  He has a bony chance type fracture through the L2 vertebrae which is not displaced.  Assessment/Plan: L2 fracture: This will likely heal well in a TLSO.  The patient has an established Licensed conveyancer in his hometown who has recently done surgery on him.  He can follow-up with him after discharge.  Please call if I can be of further assistance.  Ophelia Charter 07/05/2017, 9:25 AM

## 2017-07-05 NOTE — Evaluation (Signed)
Physical Therapy Evaluation Patient Details Name: Mitchell Weeks MRN: [id number] DOB: [birth year] Today's Date: 07/05/2017   History of Present Illness  The patient is an 84 year old white male with multiple medical problems including legal blindness, Patient is status post CABG in 2001.  He is medically managed for hypertension hypercholesterolemia.  He has had several spine surgeries by Dr. Lucius Connurani, and orthopedic surgeon in [place].  His most recent surgery was approximately 4 months ago. The patient took a fall yesterday onset of  back pain. CT of the abdomen pelvis demonstrated a L2 fracture.  He has chronic numbness in his feet. Vascular surgery is also following monitoring his aorta for bleeding.    Clinical Impression  Pt was able to get up with therapists and walk the hallways with min assist overall.  TLSO donned EOB and back precautions reviewed (bring back handout tomorrow). Pt will benefit from stair training prior to d/c.  He is familiar with back precautions from previous lumbar surgery.   PT to follow acutely for deficits listed below.       Follow Up Recommendations Home health PT;Supervision for mobility/OOB    Equipment Recommendations  None recommended by PT    Recommendations for Other Services   NA    Precautions / Restrictions Precautions Precautions: Back Precaution Comments: verbally reviewed with pt and wife.  Remembered from previous surgery.  Required Braces or Orthoses: Spinal Brace Spinal Brace: Thoracolumbosacral orthotic;Applied in sitting position      Mobility  Bed Mobility Overal bed mobility: Needs Assistance Bed Mobility: Sit to Sidelying         Sit to sidelying: Min guard General bed mobility comments: Min guard assist to ensure pt got his legs both up into the bed from sitting.   Transfers Overall transfer level: Needs assistance Equipment used: Rolling walker (2 wheeled) Transfers: Sit to/from Stand Sit to Stand: Min assist;+2  safety/equipment         General transfer comment: Min assist to support trunk during transitions.  Verbal cues for safe hand placement and safe RW use during transitions as well as to ensure he feels the bed on the back of his legs prior to sitting down.   Ambulation/Gait Ambulation/Gait assistance: Min assist;+2 safety/equipment Ambulation Distance (Feet): 75 Feet Assistive device: Rolling walker (2 wheeled) Gait Pattern/deviations: Step-through pattern     General Gait Details: Pt seemed to have poor sensation in bil feet as he walked as if he did not quite know where he was placing them on the floor.  Pt did not have signs of buckling, initially needed min assist, but could back off to supervision by the end of the walk.          Balance Overall balance assessment: Needs assistance Sitting-balance support: Feet supported;Bilateral upper extremity supported Sitting balance-Leahy Scale: Fair     Standing balance support: Bilateral upper extremity supported Standing balance-Leahy Scale: Poor                               Pertinent Vitals/Pain Pain Assessment: 0-10 Pain Score: 6  Pain Location: lumber pain Pain Descriptors / Indicators: Constant;Discomfort;Grimacing Pain Intervention(s): Limited activity within patient's tolerance;Monitored during session;Repositioned    Home Living Family/patient expects to be discharged to:: Private residence Living Arrangements: Spouse/significant other Available Help at Discharge: Family;Available 24 hours/day Type of Home: Apartment Home Access: Stairs to enter Entrance Stairs-Rails: Can reach both;Right;Left Entrance Stairs-Number of Steps: 5 Home Layout:  One level Home Equipment: Cane - single point;Walker - 4 wheels;Grab bars - tub/shower      Prior Function Level of Independence: Needs assistance   Gait / Transfers Assistance Needed: SPC for ambulation  ADL's / Homemaking Assistance Needed: assist for LB  dressing        Hand Dominance   Dominant Hand: Right    Extremity/Trunk Assessment   Upper Extremity Assessment Upper Extremity Assessment: Defer to OT evaluation    Lower Extremity Assessment Lower Extremity Assessment: RLE deficits/detail;LLE deficits/detail RLE Deficits / Details: generalized weakness, no new numbness, tingling or burning.  RLE Sensation: history of peripheral neuropathy LLE Deficits / Details: generalized weakness, no new numbness, tingling or burning.  LLE Sensation: history of peripheral neuropathy    Cervical / Trunk Assessment Cervical / Trunk Assessment: Other exceptions Cervical / Trunk Exceptions: h/o lumbar spine surgery.  Communication   Communication: No difficulties  Cognition Arousal/Alertness: Awake/alert Behavior During Therapy: WFL for tasks assessed/performed Overall Cognitive Status: Within Functional Limits for tasks assessed                                               Assessment/Plan    PT Assessment Patient needs continued PT services  PT Problem List Decreased strength;Decreased activity tolerance;Decreased mobility;Decreased balance;Decreased knowledge of use of DME;Pain       PT Treatment Interventions DME instruction;Gait training;Stair training;Functional mobility training;Therapeutic activities;Therapeutic exercise;Balance training;Patient/family education    PT Goals (Current goals can be found in the Care Plan section)  Acute Rehab PT Goals Patient Stated Goal: to stop falling so much PT Goal Formulation: With patient Time For Goal Achievement: 07/19/17 Potential to Achieve Goals: Good    Frequency Min 5X/week        Co-evaluation PT/OT/SLP Co-Evaluation/Treatment: Yes Reason for Co-Treatment: Other (comment)(due to imminant d/c order) PT goals addressed during session: Mobility/safety with mobility;Balance;Proper use of DME;Strengthening/ROM         AM-PAC PT "6 Clicks" Daily  Activity  Outcome Measure Difficulty turning over in bed (including adjusting bedclothes, sheets and blankets)?: A Little Difficulty moving from lying on back to sitting on the side of the bed? : A Little Difficulty sitting down on and standing up from a chair with arms (e.g., wheelchair, bedside commode, etc,.)?: A Little Help needed moving to and from a bed to chair (including a wheelchair)?: A Little Help needed walking in hospital room?: A Little Help needed climbing 3-5 steps with a railing? : A Little 6 Click Score: 18    End of Session Equipment Utilized During Treatment: Back brace Activity Tolerance: Patient tolerated treatment well Patient left: in bed;with call bell/phone within reach;with family/visitor present Nurse Communication: Mobility status PT Visit Diagnosis: History of falling (Z91.81);Difficulty in walking, not elsewhere classified (R26.2);Pain Pain - Right/Left: (lower) Pain - part of body: (back)    Time: 1610-9604 PT Time Calculation (min) (ACUTE ONLY): 19 min   Charges:       Lurena Joiner B. Kole Hilyard, PT, DPT [phone]    PT Evaluation $PT Eval Moderate Complexity: 1 Mod     07/05/2017, 5:14 PM

## 2017-07-06 ENCOUNTER — Inpatient Hospital Stay (HOSPITAL_COMMUNITY): Payer: Medicare HMO

## 2017-07-06 DIAGNOSIS — R109 Unspecified abdominal pain: Secondary | ICD-10-CM | POA: Diagnosis not present

## 2017-07-06 DIAGNOSIS — M545 Low back pain: Secondary | ICD-10-CM | POA: Diagnosis not present

## 2017-07-06 LAB — CBC
HEMATOCRIT: 37.8 % — AB (ref 39.0–52.0)
HEMOGLOBIN: 12.6 g/dL — AB (ref 13.0–17.0)
MCH: 30.7 pg (ref 26.0–34.0)
MCHC: 33.3 g/dL (ref 30.0–36.0)
MCV: 92.2 fL (ref 78.0–100.0)
PLATELETS: 141 10*3/uL — AB (ref 150–400)
RBC: 4.1 MIL/uL — AB (ref 4.22–5.81)
RDW: 14 % (ref 11.5–15.5)
WBC: 8.2 10*3/uL (ref 4.0–10.5)

## 2017-07-06 LAB — BASIC METABOLIC PANEL
ANION GAP: 4 — AB (ref 5–15)
BUN: 14 mg/dL (ref 6–20)
CO2: 27 mmol/L (ref 22–32)
Calcium: 8.6 mg/dL — ABNORMAL LOW (ref 8.9–10.3)
Chloride: 107 mmol/L (ref 101–111)
Creatinine, Ser: 1.06 mg/dL (ref 0.61–1.24)
GFR calc Af Amer: >60 mL/min (ref >60)
GLUCOSE: 128 mg/dL — AB (ref 65–99)
POTASSIUM: 4.1 mmol/L (ref 3.5–5.1)
Sodium: 138 mmol/L (ref 135–145)

## 2017-07-06 MED ORDER — ACETAMINOPHEN 500 MG PO TABS: 1000.0000 mg | ORAL_TABLET | Freq: Three times a day (TID) | ORAL | 0 refills | Status: DC | PRN

## 2017-07-06 MED ORDER — METHOCARBAMOL 750 MG PO TABS: 750.0000 mg | ORAL_TABLET | Freq: Three times a day (TID) | ORAL | 0 refills | Status: DC | PRN

## 2017-07-06 MED ORDER — METOPROLOL TARTRATE 25 MG PO TABS: 25.0000 mg | ORAL_TABLET | Freq: Two times a day (BID) | ORAL | 0 refills | Status: DC

## 2017-07-06 MED ORDER — IOPAMIDOL (ISOVUE-370) INJECTION 76%
INTRAVENOUS | Status: AC
  Filled 2017-07-06: qty 100

## 2017-07-06 MED ORDER — TRAMADOL HCL 50 MG PO TABS: 50.0000 mg | ORAL_TABLET | Freq: Four times a day (QID) | ORAL | 0 refills | Status: DC | PRN

## 2017-07-06 MED ORDER — IOPAMIDOL (ISOVUE-370) INJECTION 76%
100.0000 mL | Freq: Once | INTRAVENOUS | Status: AC | PRN
  Administered 2017-07-06: 100 mL via INTRAVENOUS

## 2017-07-06 NOTE — Progress Notes (Signed)
Subjective: The patient is alert and pleasant.  His TLSO is at the bedside.  He wants to go home.  He ambulated yesterday.  Objective: Vital signs in last 24 hours: Temp:  [98.1 F (36.7 C)-98.6 F (37 C)] 98.1 F (36.7 C) (06/07 0755) Pulse Rate:  [38-80] 61 (06/07 0755) Resp:  [12-21] 19 (06/07 0755) BP: (99-128)/(51-64) 121/62 (06/07 0755) SpO2:  [94 %-100 %] 94 % (06/07 0755) Estimated body mass index is 31.66 kg/m as calculated from the following:   Height as of this encounter: 5\' 11"  (1.803 m).   Weight as of this encounter: 103 kg (227 lb).   Intake/Output from previous day: 06/06 0701 - 06/07 0700 In: 2340 [P.O.:360; I.V.:1980] Out: 500 [Urine:500] Intake/Output this shift: No intake/output data recorded.  Physical exam patient is alert and pleasant.  His strength is grossly normal in his lower extremities.  Lab Results: Recent Labs    07/05/17 0413 07/06/17 0347  WBC 11.0* 8.2  HGB 13.4 12.6*  HCT 39.7 37.8*  PLT 183 141*   BMET Recent Labs    07/05/17 0413 07/06/17 0347  NA 138 138  K 4.1 4.1  CL 105 107  CO2 26 27  GLUCOSE 125* 128*  BUN 12 14  CREATININE 1.15 1.06  CALCIUM 9.0 8.6*    Studies/Results: No results found.  Assessment/Plan: L2 fracture: Patient is progressing well in the TLSO.  From my point of view he can be discharged to home and follow-up with Dr. Lucius Connurani in [place].  I will sign off.  Please call if I can be of further assistance.  LOS: 1 day     Cristi LoronJeffrey D Talbot [facility]arch 07/06/2017, 8:20 AM

## 2017-07-06 NOTE — Progress Notes (Signed)
Occupational Therapy Treatment Patient Details Name: Mitchell Weeks MRN: [id number] DOB: Sep 03, 1932 Today's Date: 07/06/2017    History of present illness The patient is an 84 year old white male with multiple medical problems including legal blindness, Patient is status post CABG in 2001.  He is medically managed for hypertension hypercholesterolemia.  He has had several spine surgeries by Dr. Lucius Conn, and orthopedic surgeon in [place].  His most recent surgery was approximately 4 months ago. The patient took a fall yesterday onset of  back pain. CT of the abdomen pelvis demonstrated a L2 fracture.  He has chronic numbness in his feet. Vascular surgery is also following monitoring his aorta for bleeding.     OT comments  Brace education with spouse for proper tech. Don/doff. Pt.s spouse able to return demo.  Bed mobility with S. Pt. And spouse decline need for A/E as wife able to assist with LB ADLS.    Follow Up Recommendations  Supervision/Assistance - 24 hour    Equipment Recommendations  Tub/shower seat    Recommendations for Other Services      Precautions / Restrictions Precautions Precautions: Back Precaution Comments: verbally reviewed with pt and wife.  Remembered from previous surgery.  Required Braces or Orthoses: Spinal Brace Spinal Brace: Thoracolumbosacral orthotic;Applied in sitting position       Mobility Bed Mobility Overal bed mobility: Needs Assistance Bed Mobility: Rolling;Sidelying to Sit;Sit to Sidelying   Sidelying to sit: Supervision     Sit to sidelying: Supervision General bed mobility comments: hob flat enter/exit from L side without physical assistance  Transfers                      Balance                                           ADL either performed or assessed with clinical judgement   ADL Overall ADL's : Needs assistance/impaired               Lower Body Bathing Details (indicate cue type and reason):  reports wife able to assist.  wife present and confirms she can assist Upper Body Dressing : Maximal assistance;Sitting;With caregiver independent assisting Upper Body Dressing Details (indicate cue type and reason): to don brace - had wife don for cont. education and preparation for home   Lower Body Dressing Details (indicate cue type and reason): pt. and spouse decline need for A/E state wife to asssit at home               General ADL Comments: deferred amb. to b.room as pt. having multiple tachycardia events while seated eob. assisted back to bed after brace education completed     Vision       Perception     Praxis      Cognition Arousal/Alertness: Awake/alert Behavior During Therapy: WFL for tasks assessed/performed Overall Cognitive Status: Within Functional Limits for tasks assessed                                          Exercises     Shoulder Instructions       General Comments      Pertinent Vitals/ Pain       Pain Assessment: 0-10 Pain Score: 0-No pain  Home Living                                          Prior Functioning/Environment              Frequency  Min 2X/week        Progress Toward Goals  OT Goals(current goals can now be found in the care plan section)  Progress towards OT goals: Progressing toward goals     Plan      Co-evaluation                 AM-PAC PT "6 Clicks" Daily Activity     Outcome Measure   Help from another person eating meals?: None Help from another person taking care of personal grooming?: A Little Help from another person toileting, which includes using toliet, bedpan, or urinal?: A Lot Help from another person bathing (including washing, rinsing, drying)?: A Lot Help from another person to put on and taking off regular upper body clothing?: A Lot Help from another person to put on and taking off regular lower body clothing?: A Lot 6 Click Score: 15     End of Session Equipment Utilized During Treatment: Back brace  OT Visit Diagnosis: Unsteadiness on feet (R26.81);Other abnormalities of gait and mobility (R26.89);History of falling (Z91.81);Pain   Activity Tolerance Patient tolerated treatment well   Patient Left in bed;with call bell/phone within reach;with family/visitor present   Nurse Communication Other (comment)(reported a cup of pills sitting on lid of saniwipes in the room)        Time: [id number] OT Time Calculation (min): 22 min  Charges: OT General Charges $OT Visit: 1 Visit OT Treatments $Self Care/Home Management : 8-22 mins   Robet LeuMorris, Shelitha Magley Lorraine, COTA/L 07/06/2017, 10:13 AM

## 2017-07-06 NOTE — Care Management Obs Status (Signed)
MEDICARE OBSERVATION STATUS NOTIFICATION   Patient Details  Name: Mitchell Weeks MRN: [id number] Date of Birth: 04-Sep-1932   Medicare Observation Status Notification Given:  Yes    Mitchell Weeks, Mitchell Mccreadie M, RN 07/06/2017, 2:13 PM

## 2017-07-06 NOTE — Progress Notes (Addendum)
Telemetry tech called nurse and reported pt had a 2.69 second pause and an extra p wave. Nurse chescke pt blood pressure and pulse per flowsheet. Pt was asymptomatic. Nurse called on call MD for Trauma  And notified them of the event. No new orders received. Will cont to mont.

## 2017-07-06 NOTE — Discharge Summary (Signed)
Physician Discharge Summary  Patient ID: Mitchell Weeks MRN: [id number] DOB/AGE: 1932/12/23 84 y.o.  Admit date: 07/05/2017 Discharge date: 07/06/2017  Discharge Diagnoses Fall L2 fracture Concern for possible aortic injury  Consultants Neurosurgery Vascular surgery  Procedures None  HPI: Patient transferred from [facility] after the patient got up and fell over a chair at home.  He presented complaining of low back pain after the fall and some mid abdominal pain.  He denies nausea or vomiting.  He has a history of multiple lumbar spine surgeries for fusions over 10 years ago.  He has some baseline numbness of both feet.  He was evaluated at [facility] and found to have an L2 fracture which was nondisplaced.  His neurological examination was stable.  He also had some changes on CT scan felt to be related to his aorta.  I reviewed the films with the radiologist here at [facility] these changes are quite minimal.  There is no evidence of rupture, dissection or significant hematoma.  There is a small hematoma associate with L2.  Since they have no spine surgeons available they requested transfer to [facility].  Currently, he is complaining of some low back pain.  He denies any significant change to strength to his lower extremities and has baseline numbness present which was present before the fall.  He has mild epigastric abdominal pain currently.  He denies any headache, neck pain, chest pain, or shortness of breath.  He has a history of a CABG in 2001 and takes medication for hypertension and high cholesterol.   Hospital Course: Neurosurgery consulted and recommended TLSO brace when out of bed and follow up with Dr. Loralie Champagneurrani who has operated on patient's spine in the past. Patient worked with PT/OT who felt patient would benefit from continued home PT and this was arranged. Patient experienced some bradycardia during admission, beta-blocker dose decreased and patient improved. Patient  advised to continue reduced dose beta-blocker and follow up with cardiologist. Patient discharged home with family in stable condition and knows to follow up as below.    Allergies as of 07/06/2017      Reactions   Niacin Other (See Comments)   Flushing (ALLERGY/intolerance)      Medication List    TAKE these medications   acetaminophen 500 MG tablet Commonly known as:  TYLENOL Take 2 tablets (1,000 mg total) by mouth every 8 (eight) hours as needed for mild pain.   ALPRAZolam 0.25 MG tablet Commonly known as:  XANAX Take 0.25 mg by mouth daily as needed.   escitalopram 10 MG tablet Commonly known as:  LEXAPRO Take 10 mg by mouth daily.   methocarbamol 750 MG tablet Commonly known as:  ROBAXIN Take 1 tablet (750 mg total) by mouth every 8 (eight) hours as needed for muscle spasms.   metoprolol tartrate 25 MG tablet Commonly known as:  LOPRESSOR Take 1 tablet (25 mg total) by mouth 2 (two) times daily. What changed:    medication strength  how much to take   multivitamin with minerals Tabs tablet Take 1 tablet by mouth daily.   nitroGLYCERIN 0.4 MG SL tablet Commonly known as:  NITROSTAT Place 0.4 mg under the tongue every 5 (five) minutes as needed for chest pain.   simvastatin 80 MG tablet Commonly known as:  ZOCOR Take 40 mg by mouth every evening.   SYSTANE BALANCE OP Apply 1 drop to eye as needed (for dry eyes).   traMADol 50 MG tablet Commonly known as:  ULTRAM Take 1 tablet (50 mg total) by mouth every 6 (six) hours as needed. What changed:    when to take this  reasons to take this            Durable Medical Equipment  (From admission, onward)        Start     Ordered   07/06/17 0917  For home use only DME Tub bench  Once     07/06/17 0916   07/06/17 0703  For home use only DME 3 n 1  Once     07/06/17 0702       Follow-up Information    [facility] Follow up.   Why:  Call as needed with questions or concerns. No follow  up scheduled. Contact information: [address] [place] [address] [phone]       Garnette Gunner, MD. Call.   Specialty:  Orthopedic Surgery Why:  Call and schedule a follow up appointment for 1-2 weeks for lumbar spine fracture. Contact information: [address] [place] [address] [phone]        Diamond Nickel., MD. Call.   Specialty:  Cardiology Why:  Call and schedule a follow up for 1-2 weeks after hospital discharge. Continue to take reduced dose of beta blocker until advised otherwise by your cardiologist. Contact information: [address] [place] [address] [phone]           Signed: Wells Guiles , [facility] 07/06/2017, 1:30 PM Pager: [phone] Trauma: [phone] Mon-Fri 7:00 am-4:30 pm Sat-Sun 7:00 am-11:30 am

## 2017-07-06 NOTE — Progress Notes (Addendum)
  Progress Note    07/06/2017 8:51 AM Hospital Day 1  Subjective:  Feels his back pain is getting better; says he has an occasional abdominal pain   Afebrile HR 70's-70's  100's-120's systolic 97% RA  Vitals:   07/06/17 0700 07/06/17 0755  BP:  121/62  Pulse: (!) 58 61  Resp: 12 19  Temp:  98.1 F (36.7 C)  SpO2: 96% 94%    Physical Exam: Cardiac:  regular Lungs:  Non labored Abdomen:  Soft, NT/ND  Extremities:  Bilateral feet are warm with palpable PT pulses R>L  CBC    Component Value Date/Time   WBC 8.2 07/06/2017 0347   RBC 4.10 (L) 07/06/2017 0347   HGB 12.6 (L) 07/06/2017 0347   HCT 37.8 (L) 07/06/2017 0347   PLT 141 (L) 07/06/2017 0347   MCV 92.2 07/06/2017 0347   MCH 30.7 07/06/2017 0347   MCHC 33.3 07/06/2017 0347   RDW 14.0 07/06/2017 0347    BMET    Component Value Date/Time   NA 138 07/06/2017 0347   K 4.1 07/06/2017 0347   CL 107 07/06/2017 0347   CO2 27 07/06/2017 0347   GLUCOSE 128 (H) 07/06/2017 0347   BUN 14 07/06/2017 0347   CREATININE 1.06 07/06/2017 0347   CALCIUM 8.6 (L) 07/06/2017 0347   GFRNONAA >60 07/06/2017 0347   GFRAA >60 07/06/2017 0347    INR    Component Value Date/Time   INR 1.08 07/05/2017 0837     Intake/Output Summary (Last 24 hours) at 07/06/2017 0851 Last data filed at 07/06/2017 [id number] Gross per 24 hour  Intake 2340 ml  Output 200 ml  Net 2140 ml     Assessment/Plan:  84 y.o. male who fell and has possible aortic injury and L2 fracture Hospital Day 1  -pt abdomen is soft and non tender to palpation with palpable right PT and faintly palpable left PT -feels his back pain is improving -blood pressure is under good control -discussed with Dr. Myra GianottiBrabham and will get CTA chest/abdomen/pelvis today or tomorrow.  Will order.   Doreatha MassedSamantha Rhyne, PA-C [facility] [phone] 07/06/2017 8:51 AM   Agree with the above.  He feels a little better.  Plan for repeat CTA.  If no significant changes,  will likely be ok for d/c from vascular perspective.  Durene CalWells Norvel Wenker

## 2017-07-06 NOTE — Progress Notes (Signed)
1545 Received report from Schering-PloughCrystal, Charity fundraiserN. Pt sitting up on side of bed, shorts on, tele leads, O2, and IV removed without difficulty. Pt awaiting son to transport home. Pt assisted with dressing and back brace applied.   1700 RN called to room asking for pain meds and muscle spasm meds. Medicated per MAR. Still awaiting son for ride home.   1730 Pt and spouse reviewed discharge information, wound care, follow up appointments, medication list and new meds. All questions answered. Pt able to understand without assistance. Awaiting ride home.   1800 Pt down to car via wheelchair with all belongings. Spouse with pt and son picked up pt to transport home. No changes in pt's status since discharge.

## 2017-07-06 NOTE — Discharge Instructions (Signed)
How to Use a Back Brace °A back brace is a form-fitting device that wraps around your trunk to support your lower back, abdomen, and hips. You may need to wear a back brace to relieve back pain or to correct a medical condition related to the back, such as abnormal curvature of the spine (scoliosis). A back brace can maintain or correct the shape of the spine and prevent a spinal problem from getting worse. A back brace can also take pressure off the layers of tissue (disks) between the bones of the spine (vertebrae). You may need a back brace to keep your back and spine in place while you heal from an injury or recover from surgery. °Back braces can be either plastic (rigid brace) or soft elastic (dynamic brace). A rigid brace usually covers both the front and back of the entire upper body. A soft brace may cover only the lower back and abdomen and may fasten with self-adhesive elastic straps. Your health care provider will recommend the proper brace for your needs and medical condition. °What are the risks? °· A back brace may not help if you do not wear it as directed by your health care provider. Be sure to wear the brace exactly as instructed in order to prevent further back problems. °· Wearing the brace may be uncomfortable at first. You may have trouble sleeping with it on. It may also be hard for you to do certain activities while wearing the brace. °How to use a back brace °Different types of braces will have different instructions for use. Follow instructions from your health care provider about: °· How to put on the brace. °· When and how often to wear the brace. In some cases, braces may need to be worn for long stretches of time. For example, a brace may need to be worn for 16-23 hours a day when used for scoliosis. °· How to take off the brace. °· Any safety tips you should follow when wearing the brace. This may include: °? Moving carefully while wearing the brace. The brace restricts your movement  and could lead to additional injuries. °? Using a cane or walker for support if you feel unsteady. °? Sitting in high, firm chairs. It may be difficult to stand up from low, soft chairs. ° °How to care for a back brace °· Do not let the back brace get wet. Typically, you will remove the brace for bathing and then put it back on afterward. °· If you have a rigid brace, be sure to store it in a safe place when you are not wearing it. This will help to prevent damage. °· Clean or wash the back brace with mild soap and water as told by your health care provider. °Contact a health care provider if: °· Your brace gets damaged. °· You have pain or discomfort when wearing the back brace. °· Your back pain is getting worse or is not improving over time. °This information is not intended to replace advice given to you by your health care provider. Make sure you discuss any questions you have with your health care provider. °Document Released: 01/05/2011 Document Revised: 02/11/2015 Document Reviewed: 09/09/2014 °Elsevier Interactive Patient Education © 2018 [employer] Inc[employer] ° ° ° °

## 2017-07-06 NOTE — Progress Notes (Signed)
Physical Therapy Treatment Patient Details Name: Mitchell Weeks MRN: [id number] DOB: 1932/11/17 Today's Date: 07/06/2017    History of Present Illness The patient is an 84 year old white male with multiple medical problems including legal blindness, Patient is status post CABG in 2001.  He is medically managed for hypertension hypercholesterolemia.  He has had several spine surgeries by Dr. Lucius Connurani, and orthopedic surgeon in [place].  His most recent surgery was approximately 4 months ago. The patient took a fall yesterday onset of  back pain. CT of the abdomen pelvis demonstrated a L2 fracture.  He has chronic numbness in his feet. Vascular surgery is also following monitoring his aorta for bleeding.      PT Comments    Pt was able to walk with RW and ascend and descend stairs simulating home entry.  He is quite unsteady on his feet with ataxic gait pattern, seemingly very little awareness of where his feet are.  I had a long discussion with the patient and his wife re: concerns for his safety.  His son will be helping him into the house at discharge, but I did not get the impression that he was going to stay with them for any period of time.  I am worried that the wife will be unable to physically help him and that he will fall again.  They both voiced awareness and that the felt safe returning home today.  Follow up therapy has been arranged.  PT to follow acutely until d/c confirmed.     Follow Up Recommendations  Home health PT;Supervision for mobility/OOB     Equipment Recommendations  None recommended by PT    Recommendations for Other Services   NA     Precautions / Restrictions Precautions Precautions: Back Required Braces or Orthoses: Spinal Brace Spinal Brace: Thoracolumbosacral orthotic;Applied in sitting position    Mobility  Bed Mobility Overal bed mobility: Needs Assistance Bed Mobility: Rolling;Sidelying to Sit Rolling: Supervision Sidelying to sit: Supervision        General bed mobility comments: Pt able to demonstrate log roll technique, extra time needed to sit up from sidelying with use of bed rail. HOB flat.   Transfers Overall transfer level: Needs assistance Equipment used: Rolling walker (2 wheeled) Transfers: Sit to/from Stand Sit to Stand: Min assist            Ambulation/Gait Ambulation/Gait assistance: Min assist Ambulation Distance (Feet): 100 Feet Assistive device: Rolling walker (2 wheeled) Gait Pattern/deviations: Step-through pattern;Ataxic Gait velocity: decreased   General Gait Details: Pt with ataxic gait pattern, needing assist for balance at his trunk and to stabilize RW as he lifts it up with only two points touching at times.  Verbal cues to keep both feet inside of the RW especially when turning.            Balance Overall balance assessment: Needs assistance Sitting-balance support: Feet supported;No upper extremity supported Sitting balance-Leahy Scale: Good     Standing balance support: Bilateral upper extremity supported Standing balance-Leahy Scale: Poor                              Cognition Arousal/Alertness: Awake/alert Behavior During Therapy: WFL for tasks assessed/performed Overall Cognitive Status: Within Functional Limits for tasks assessed  Pertinent Vitals/Pain Pain Assessment: 0-10 Pain Score: 4  Pain Location: lumber pain Pain Descriptors / Indicators: Constant;Discomfort;Grimacing Pain Intervention(s): Limited activity within patient's tolerance;Monitored during session;Repositioned           PT Goals (current goals can now be found in the care plan section) Acute Rehab PT Goals Patient Stated Goal: to stop falling so much Progress towards PT goals: Progressing toward goals    Frequency    Min 5X/week      PT Plan Current plan remains appropriate       AM-PAC PT "6 Clicks" Daily  Activity  Outcome Measure  Difficulty turning over in bed (including adjusting bedclothes, sheets and blankets)?: A Little Difficulty moving from lying on back to sitting on the side of the bed? : A Little Difficulty sitting down on and standing up from a chair with arms (e.g., wheelchair, bedside commode, etc,.)?: A Little Help needed moving to and from a bed to chair (including a wheelchair)?: A Little Help needed walking in hospital room?: A Little Help needed climbing 3-5 steps with a railing? : A Little 6 Click Score: 18    End of Session Equipment Utilized During Treatment: Gait belt;Back brace Activity Tolerance: Patient tolerated treatment well Patient left: in bed;with call bell/phone within reach;Other (comment)(seated EOB) Nurse Communication: Mobility status PT Visit Diagnosis: History of falling (Z91.81);Difficulty in walking, not elsewhere classified (R26.2);Pain Pain - Right/Left: (lower) Pain - part of body: (back)     Time: 1610-9604 PT Time Calculation (min) (ACUTE ONLY): 21 min  Charges:  $Gait Training: 8-22 mins          Bowen Kia B. Ellyn Rubiano, PT, DPT [phone]            07/06/2017, 3:47 PM

## 2017-07-06 NOTE — Care Management Note (Signed)
Case Management Note  Patient Details  Name: Mitchell Weeks MRN: [id number] Date of Birth: Nov 29, 1932  Subjective/Objective:  Pt admitted on 07/05/17 s/p fall with L2 fracture.  PTA, pt needs assist with ADLS; uses assistive devices.  He lives with spouse, who can provide 24h assistance.                    Action/Plan: PT/OT recommending HH follow up, and pt agreeable.  Referral to [facility], per pt/spouse choice.  Start of care 24-48h post dc date.  Pt and wife refused recommended DME, as they state "their apartment is too small."  Expected Discharge Date:  07/06/17               Expected Discharge Plan:  Home w Home Health Services  In-House Referral:  Clinical Social Work  Discharge planning Services  CM Consult  Post Acute Care Choice:  Home Health Choice offered to:  Patient, Spouse  DME Arranged:  Patient refused services DME Agency:     HH Arranged:  PT, OT HH Agency:  [facility]  Status of Service:  Completed, signed off  If discussed at Long Length of Stay Meetings, dates discussed:    Additional Comments:  Glennon Macmerson, Keano Guggenheim M, RN 07/06/2017, 3:37 PM

## 2017-07-06 NOTE — Progress Notes (Signed)
[facility] Progress Note     Subjective: CC: back pain Back pain is improving and well controlled with medications. Patient working well with therapies, hopeful to get home soon. Discussed repeat CTA today and possibly home today or tomorrow. Abdominal pain improving, passing flatus, denies nausea. Denies chest pain, SOB, dizziness.   Objective: Vital signs in last 24 hours: Temp:  [98.1 F (36.7 C)-98.6 F (37 C)] 98.1 F (36.7 C) (06/07 0755) Pulse Rate:  [38-80] 61 (06/07 0755) Resp:  [12-21] 19 (06/07 0755) BP: (99-128)/(51-64) 121/62 (06/07 0755) SpO2:  [94 %-100 %] 94 % (06/07 0755)    Intake/Output from previous day: 06/06 0701 - 06/07 0700 In: 2340 [P.O.:360; I.V.:1980] Out: 500 [Urine:500] Intake/Output this shift: No intake/output data recorded.  PE: Gen:  Alert, NAD, pleasant Card:  Regular rate and rhythm, pedal pulses 1+ BL Pulm:  Normal effort, clear to auscultation bilaterally Abd: Soft, non-tender, non-distended, bowel sounds present Ext: strength 5/5 in bilateral LEs, strength 5/5 in bilateral UEs Skin: warm and dry, no rashes  Psych: A&Ox3   Lab Results:  Recent Labs    07/05/17 0413 07/06/17 0347  WBC 11.0* 8.2  HGB 13.4 12.6*  HCT 39.7 37.8*  PLT 183 141*   BMET Recent Labs    07/05/17 0413 07/06/17 0347  NA 138 138  K 4.1 4.1  CL 105 107  CO2 26 27  GLUCOSE 125* 128*  BUN 12 14  CREATININE 1.15 1.06  CALCIUM 9.0 8.6*   PT/INR Recent Labs    07/05/17 0837  LABPROT 13.9  INR 1.08   CMP     Component Value Date/Time   NA 138 07/06/2017 0347   K 4.1 07/06/2017 0347   CL 107 07/06/2017 0347   CO2 27 07/06/2017 0347   GLUCOSE 128 (H) 07/06/2017 0347   BUN 14 07/06/2017 0347   CREATININE 1.06 07/06/2017 0347   CALCIUM 8.6 (L) 07/06/2017 0347   PROT 5.7 (L) 07/05/2017 0413   ALBUMIN 3.3 (L) 07/05/2017 0413   AST 103 (H) 07/05/2017 0413   ALT 57 07/05/2017 0413   ALKPHOS 94 07/05/2017 0413   BILITOT 1.6 (H)  07/05/2017 0413   GFRNONAA >60 07/06/2017 0347   GFRAA >60 07/06/2017 0347   Lipase  No results found for: LIPASE     Studies/Results: No results found.  Anti-infectives: Anti-infectives (From admission, onward)   None       Assessment/Plan Fall L2 fracture - progressing well with TLSO, HHPT ordered, [place]. [place] Possible aortic injury - BP well controlled, repeat CTA today HTN/CAD s/p CABG 2001 - pt bradycardic with some AVB yesterday, decreased BB dose yesterday and HR has improved, f/u with cardiologist on d/c  FEN: regular diet; saline lock IV VTE: SCDs, lovenox ID: no abx indicated  Dispo: Repeat CTA, if ok can d/c home later today vs. Tomorrow AM  LOS: 1 day    Wells GuilesKelly Rayburn , [facility] 07/06/2017, 9:47 AM Pager: [phone] Trauma Pager: [phone] Mon-Fri 7:00 am-4:30 pm Sat-Sun 7:00 am-11:30 am

## 2017-07-06 NOTE — Clinical Social Work Note (Signed)
Clinical Social Worker met with patient and patient spouse at bedside to offer support and discuss patient plans at discharge.  Patient states that he was on a friends porch when he tried to move a chair and fell.  Patient lives at home with his wife and plans to return home with wife at discharge.    Clinical Social Worker inquired about current substance use.  Patient states that he does not drink alcohol or use drugs at anytime.  SBIRT complete.  No resources needed.  Clinical Social Worker will sign off for now as social work intervention is no longer needed. Please consult [place] again if new need arises.  Barbette Or, [place]

## 2017-08-30 DIAGNOSIS — N390 Urinary tract infection, site not specified: Secondary | ICD-10-CM | POA: Diagnosis not present

## 2017-08-30 DIAGNOSIS — I10 Essential (primary) hypertension: Secondary | ICD-10-CM | POA: Diagnosis not present

## 2017-08-30 DIAGNOSIS — R739 Hyperglycemia, unspecified: Secondary | ICD-10-CM | POA: Diagnosis not present

## 2017-08-30 DIAGNOSIS — R109 Unspecified abdominal pain: Secondary | ICD-10-CM | POA: Diagnosis not present

## 2017-08-31 DIAGNOSIS — N390 Urinary tract infection, site not specified: Secondary | ICD-10-CM | POA: Diagnosis not present

## 2017-08-31 DIAGNOSIS — S32029A Unspecified fracture of second lumbar vertebra, initial encounter for closed fracture: Secondary | ICD-10-CM

## 2017-09-01 DIAGNOSIS — N390 Urinary tract infection, site not specified: Secondary | ICD-10-CM | POA: Diagnosis not present

## 2017-09-01 DIAGNOSIS — S32029A Unspecified fracture of second lumbar vertebra, initial encounter for closed fracture: Secondary | ICD-10-CM | POA: Diagnosis not present

## 2021-04-20 DIAGNOSIS — I340 Nonrheumatic mitral (valve) insufficiency: Secondary | ICD-10-CM | POA: Diagnosis not present

## 2021-04-20 DIAGNOSIS — I361 Nonrheumatic tricuspid (valve) insufficiency: Secondary | ICD-10-CM

## 2022-04-11 DIAGNOSIS — Z950 Presence of cardiac pacemaker: Secondary | ICD-10-CM | POA: Diagnosis not present

## 2022-04-11 DIAGNOSIS — I517 Cardiomegaly: Secondary | ICD-10-CM | POA: Diagnosis not present

## 2022-11-15 ENCOUNTER — Emergency Department (HOSPITAL_COMMUNITY): Payer: Medicare HMO

## 2022-11-15 ENCOUNTER — Emergency Department (HOSPITAL_COMMUNITY)
Admission: EM | Admit: 2022-11-15 | Discharge: 2022-11-15 | Disposition: A | Discharge status: Skilled Nursing Facility | Payer: Medicare HMO | Attending: Emergency Medicine | Admitting: Emergency Medicine

## 2022-11-15 ENCOUNTER — Encounter (HOSPITAL_COMMUNITY): Payer: Self-pay

## 2022-11-15 ENCOUNTER — Other Ambulatory Visit: Payer: Self-pay

## 2022-11-15 DIAGNOSIS — R1012 Left upper quadrant pain: Secondary | ICD-10-CM | POA: Insufficient documentation

## 2022-11-15 DIAGNOSIS — Z79899 Other long term (current) drug therapy: Secondary | ICD-10-CM | POA: Diagnosis not present

## 2022-11-15 DIAGNOSIS — W19XXXA Unspecified fall, initial encounter: Secondary | ICD-10-CM | POA: Diagnosis not present

## 2022-11-15 DIAGNOSIS — S0083XA Contusion of other part of head, initial encounter: Secondary | ICD-10-CM

## 2022-11-15 DIAGNOSIS — Z7901 Long term (current) use of anticoagulants: Secondary | ICD-10-CM | POA: Insufficient documentation

## 2022-11-15 DIAGNOSIS — Z7984 Long term (current) use of oral hypoglycemic drugs: Secondary | ICD-10-CM | POA: Insufficient documentation

## 2022-11-15 DIAGNOSIS — S5001XA Contusion of right elbow, initial encounter: Secondary | ICD-10-CM | POA: Insufficient documentation

## 2022-11-15 DIAGNOSIS — Z950 Presence of cardiac pacemaker: Secondary | ICD-10-CM | POA: Insufficient documentation

## 2022-11-15 DIAGNOSIS — Z7982 Long term (current) use of aspirin: Secondary | ICD-10-CM | POA: Insufficient documentation

## 2022-11-15 DIAGNOSIS — Y92129 Unspecified place in nursing home as the place of occurrence of the external cause: Secondary | ICD-10-CM | POA: Insufficient documentation

## 2022-11-15 DIAGNOSIS — S80211A Abrasion, right knee, initial encounter: Secondary | ICD-10-CM | POA: Diagnosis not present

## 2022-11-15 DIAGNOSIS — M546 Pain in thoracic spine: Secondary | ICD-10-CM | POA: Insufficient documentation

## 2022-11-15 DIAGNOSIS — I2510 Atherosclerotic heart disease of native coronary artery without angina pectoris: Secondary | ICD-10-CM | POA: Diagnosis not present

## 2022-11-15 DIAGNOSIS — S0990XA Unspecified injury of head, initial encounter: Secondary | ICD-10-CM | POA: Diagnosis present

## 2022-11-15 DIAGNOSIS — S80212A Abrasion, left knee, initial encounter: Secondary | ICD-10-CM | POA: Insufficient documentation

## 2022-11-15 DIAGNOSIS — S0181XA Laceration without foreign body of other part of head, initial encounter: Secondary | ICD-10-CM | POA: Diagnosis not present

## 2022-11-15 DIAGNOSIS — M542 Cervicalgia: Secondary | ICD-10-CM | POA: Diagnosis not present

## 2022-11-15 HISTORY — DX: Unspecified atrial fibrillation: I48.91

## 2022-11-15 HISTORY — DX: Heart failure, unspecified: I50.9

## 2022-11-15 HISTORY — DX: Repeated falls: R29.6

## 2022-11-15 LAB — COMPREHENSIVE METABOLIC PANEL
ALT: 19 U/L (ref 0–44)
AST: 28 U/L (ref 15–41)
Albumin: 3.2 g/dL — ABNORMAL LOW (ref 3.5–5.0)
Alkaline Phosphatase: 106 U/L (ref 38–126)
Anion gap: 10 (ref 5–15)
BUN: 17 mg/dL (ref 8–23)
CO2: 23 mmol/L (ref 22–32)
Calcium: 8.8 mg/dL — ABNORMAL LOW (ref 8.9–10.3)
Chloride: 104 mmol/L (ref 98–111)
Creatinine, Ser: 1.21 mg/dL (ref 0.61–1.24)
GFR, Estimated: 58 mL/min — ABNORMAL LOW (ref >60)
Glucose, Bld: 102 mg/dL — ABNORMAL HIGH (ref 70–99)
Potassium: 4.3 mmol/L (ref 3.5–5.1)
Sodium: 137 mmol/L (ref 135–145)
Total Bilirubin: 1.3 mg/dL — ABNORMAL HIGH (ref 0.3–1.2)
Total Protein: 6.3 g/dL — ABNORMAL LOW (ref 6.5–8.1)

## 2022-11-15 LAB — ETHANOL: Alcohol, Ethyl (B): <10 mg/dL (ref <10)

## 2022-11-15 LAB — CBC
HCT: 47.3 % (ref 39.0–52.0)
Hemoglobin: 15.1 g/dL (ref 13.0–17.0)
MCH: 28.7 pg (ref 26.0–34.0)
MCHC: 31.9 g/dL (ref 30.0–36.0)
MCV: 89.9 fL (ref 80.0–100.0)
Platelets: 169 10*3/uL (ref 150–400)
RBC: 5.26 MIL/uL (ref 4.22–5.81)
RDW: 14.3 % (ref 11.5–15.5)
WBC: 8 10*3/uL (ref 4.0–10.5)
nRBC: 0 % (ref 0.0–0.2)

## 2022-11-15 LAB — I-STAT CHEM 8, ED
BUN: 21 mg/dL (ref 8–23)
Calcium, Ion: 1.11 mmol/L — ABNORMAL LOW (ref 1.15–1.40)
Chloride: 102 mmol/L (ref 98–111)
Creatinine, Ser: 1.2 mg/dL (ref 0.61–1.24)
Glucose, Bld: 101 mg/dL — ABNORMAL HIGH (ref 70–99)
HCT: 47 % (ref 39.0–52.0)
Hemoglobin: 16 g/dL (ref 13.0–17.0)
Potassium: 4 mmol/L (ref 3.5–5.1)
Sodium: 139 mmol/L (ref 135–145)
TCO2: 26 mmol/L (ref 22–32)

## 2022-11-15 LAB — PROTIME-INR
INR: 1.3 — ABNORMAL HIGH (ref 0.8–1.2)
Prothrombin Time: 16.2 s — ABNORMAL HIGH (ref 11.4–15.2)

## 2022-11-15 LAB — SAMPLE TO BLOOD BANK

## 2022-11-15 LAB — I-STAT CG4 LACTIC ACID, ED: Lactic Acid, Venous: 2 mmol/L (ref 0.5–1.9)

## 2022-11-15 MED ORDER — LIDOCAINE HCL (PF) 1 % IJ SOLN
5.0000 mL | Freq: Once | INTRAMUSCULAR | Status: AC
  Administered 2022-11-15: 5 mL
  Filled 2022-11-15: qty 5

## 2022-11-15 MED ORDER — IOHEXOL 350 MG/ML SOLN
75.0000 mL | Freq: Once | INTRAVENOUS | Status: AC | PRN
  Administered 2022-11-15: 75 mL via INTRAVENOUS

## 2022-11-15 NOTE — Discharge Instructions (Signed)
You were seen in the emergency department after your fall.  Your x-rays and CT scans did not show any broken bones or internal bleeding.  You do have a large bump on your forehead called a hematoma with a cut over the hematoma.  We did put in stitches.  He had 14 stitches and they should be removed in about 5 to 7 days.  You can ice your forehead to help with the swelling and sleep with your head slightly propped up.  You can take Tylenol as needed for pain.  You should follow-up with your primary doctor to have your symptoms rechecked.  You can have your sutures removed with your primary doctor, urgent care or in the ER.  You should keep your forehead dry for the next 24 hours.  You can then shower, just do not soak your head underwater while the stitches are in place.  You should return to the emergency department if you notice pus draining from the wound, spreading redness, you have severe headaches, repetitive vomiting or any other new or concerning symptoms.

## 2022-11-15 NOTE — Progress Notes (Signed)
Orthopedic Tech Progress Note Patient Details:  RAHSAAN WEAKLAND 09/29/1932 [id number] Level 2 Trauma  Patient ID: Lillard Anes, male   DOB: 23-Jul-1932, 89 y.o.   MRN: [id number]  Genelle Bal Cheynne Virden 11/15/2022, 9:30 AM

## 2022-11-15 NOTE — ED Triage Notes (Signed)
Level 2 trauma activation. [facility] from [facility] s/p unwitnessed fall with head strike, LOC (approx 1 minute), on eliquis. A+Ox4 on arrival. C spine prec maintained. Paced rhythm. 18g RAC.

## 2022-11-15 NOTE — Progress Notes (Signed)
Responded to page to provide support to patient that fell on thinners. Chaplain available as needed.

## 2022-11-15 NOTE — ED Provider Notes (Signed)
[place] EMERGENCY DEPARTMENT AT [facility] Provider Note   CSN: [id number] Arrival date & time: 11/15/22  4098     History  Chief Complaint  Patient presents with   Mitchell Weeks is a 89 y.o. male.  Patient is an 89 year old male with past medical history of A-fib on Eliquis, CAD, complete heart block status post pacemaker placement presenting to the emergency department after a fall.  Per EMS, patient had an unwitnessed fall at his nursing home.  Was found to be unconscious for at least 1 minute prior to arrival.  The patient reports that he was trying to get his food tray this morning when he lost his balance and fell.  Patient states that he is having upper back and lower neck pain.  He denies any numbness or weakness.  He states that he has chronic left-sided flank pain that is worse today than usual.  He states that he has had a cough recently but denies any chest pain or shortness of breath prior to the fall today.  He states that he is up-to-date on all his immunizations.  The history is provided by the patient and the EMS personnel.  Fall       Home Medications Prior to Admission medications   Medication Sig Start Date End Date Taking? Authorizing Provider  ALPRAZolam Prudy Feeler) 0.25 MG tablet Take 0.25 mg by mouth daily as needed for anxiety. 05/18/17  Yes [provider]  amiodarone (PACERONE) 200 MG tablet Take 200 mg by mouth daily. 05/24/22 05/24/23 Yes [provider]  aspirin 81 MG chewable tablet Chew 81 mg by mouth daily. 02/16/21  Yes [provider]  atorvastatin (LIPITOR) 40 MG tablet Take 40 mg by mouth daily. 08/18/20  Yes [provider]  ELIQUIS 5 MG TABS tablet Take 5 mg by mouth 2 (two) times daily.   Yes [provider]  escitalopram (LEXAPRO) 10 MG tablet Take 10 mg by mouth daily. 01/17/16  Yes [provider]  furosemide (LASIX) 20 MG tablet Take 20 mg by mouth daily.   Yes [provider]  gabapentin (NEURONTIN) 300 MG capsule Take 300 mg by mouth 3 (three) times daily.   Yes [provider]  ipratropium-albuterol (DUONEB) 0.5-2.5 (3) MG/3ML SOLN Inhale 3 mLs into the lungs every 6 (six) hours as needed (SOB/Wheezing).   Yes [provider]  isosorbide mononitrate (IMDUR) 30 MG 24 hr tablet Take 30 mg by mouth daily.   Yes [provider]  JARDIANCE 10 MG TABS tablet Take 10 mg by mouth daily.   Yes [provider]  losartan (COZAAR) 25 MG tablet Take 25 mg by mouth at bedtime. 08/24/22 08/24/23 Yes [provider]  meclizine (ANTIVERT) 25 MG tablet Take 25 mg by mouth 3 (three) times daily. Take 25mg  by mouth three times daily and as needed for vertigo at bedtime. 04/11/21  Yes [provider]  nitroGLYCERIN (NITROSTAT) 0.4 MG SL tablet Place 0.4 mg under the tongue every 5 (five) minutes as needed for chest pain. 06/27/17  Yes [provider]      Allergies    Niacin    Review of Systems   Review of Systems  Physical Exam Updated Vital Signs BP 104/60   Pulse 84   Temp 98 F (36.7 C) (Oral)   Resp 20   Ht 5\' 11"  (1.803 m)   Wt 98 kg   SpO2 100%   BMI 30.13  kg/m  Physical Exam Vitals and nursing note reviewed.  Constitutional:      General: He is not in acute distress.    Appearance: Normal appearance.  HENT:     Head: Normocephalic.     Comments: Forehead laceration, no active bleeding    Nose: Nose normal.     Mouth/Throat:     Mouth: Mucous membranes are moist.     Pharynx: Oropharynx is clear.  Eyes:     Extraocular Movements: Extraocular movements intact.     Pupils: Pupils are equal, round, and reactive to light.     Comments: L eye medial subconjunctival hemorrhage No periorbital tenderness to palpation  Neck:     Comments: Lower c-spine tenderness, c-collar in place Cardiovascular:     Rate and Rhythm: Normal rate and regular rhythm.     Heart sounds: Normal heart  sounds.  Pulmonary:     Effort: Pulmonary effort is normal.     Breath sounds: Normal breath sounds.  Abdominal:     General: Abdomen is flat.     Palpations: Abdomen is soft.     Tenderness: There is abdominal tenderness (LUQ, L flank). There is no guarding or rebound.  Musculoskeletal:     Comments: Midline T-spine tenderness to palpation, no L-spine tenderness Pelvis stable, non-tender Tenderness to palpation of R elbow with contusions, no obvious deformity Tenderness to palpation of R knee with overlying abrasion, no pain with hip internal/external rotation No tenderness to LUE or LLE  Skin:    General: Skin is warm and dry.     Comments: Non-bleeding abrasion to L knee in addition to skin findings above  Neurological:     General: No focal deficit present.     Mental Status: He is alert and oriented to person, place, and time.     Sensory: No sensory deficit.     Motor: No weakness.  Psychiatric:        Mood and Affect: Mood normal.        Behavior: Behavior normal.     ED Results / Procedures / Treatments   Labs (all labs ordered are listed, but only abnormal results are displayed) Labs Reviewed  COMPREHENSIVE METABOLIC PANEL - Abnormal; Notable for the following components:      Result Value   Glucose, Bld 102 (*)    Calcium 8.8 (*)    Total Protein 6.3 (*)    Albumin 3.2 (*)    Total Bilirubin 1.3 (*)    GFR, Estimated 58 (*)    All other components within normal limits  PROTIME-INR - Abnormal; Notable for the following components:   Prothrombin Time 16.2 (*)    INR 1.3 (*)    All other components within normal limits  I-STAT CHEM 8, ED - Abnormal; Notable for the following components:   Glucose, Bld 101 (*)    Calcium, Ion 1.11 (*)    All other components within normal limits  I-STAT CG4 LACTIC ACID, ED - Abnormal; Notable for the following components:   Lactic Acid, Venous 2.0 (*)    All other components within normal limits  CBC  ETHANOL  URINALYSIS,  ROUTINE W REFLEX MICROSCOPIC  SAMPLE TO BLOOD BANK    EKG EKG Interpretation Date/Time:  Wednesday November 15 2022 10:15:34 EDT Ventricular Rate:  70 PR Interval:    QRS Duration:  166 QT Interval:  499 QTC Calculation: 539 R Axis:   -69  Text Interpretation: Afib/flutter and ventricular-paced rhythm No further analysis attempted  due to paced rhythm No significant change since last tracing Confirmed by Elayne Snare ([id number]) on 11/15/2022 10:17:28 AM  Radiology DG Pelvis Portable  Result Date: 11/15/2022 CLINICAL DATA:  Fall. EXAM: PORTABLE PELVIS 1-2 VIEWS COMPARISON:  None Available. FINDINGS: There is no evidence of pelvic fracture or diastasis. No pelvic bone lesions are seen. IMPRESSION: Negative. Electronically Signed   By: Orvan Falconer M.D.   On: 11/15/2022 11:50   DG Knee Right Port  Result Date: 11/15/2022 CLINICAL DATA:  Fall.  Knee pain. EXAM: PORTABLE RIGHT KNEE - 1-2 VIEW COMPARISON:  05/28/2019 FINDINGS: Tricompartmental osteoarthritis. Small knee joint effusion. Multiple intra-articular loose bodies as seen previously. No evidence of acute fracture. Regional arterial calcification incidentally noted. IMPRESSION: No acute or traumatic finding. 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 Electronically Signed   By: Paulina Fusi M.D.   On: 11/15/2022 11:49   DG Elbow Complete Right  Result Date: 11/15/2022 CLINICAL DATA:  Fall.  Right elbow laceration and swelling. EXAM: RIGHT ELBOW - COMPLETE 3+ VIEW COMPARISON:  None Available. FINDINGS: Four views of the right elbow. No evidence of fracture, dislocation, or joint effusion. Severe degenerative changes with heterotopic ossification about the elbow joint. Soft tissues are unremarkable. IMPRESSION: No acute fracture or dislocation. Severe degenerative changes with heterotopic ossification about the elbow joint. Electronically Signed   By: Orvan Falconer M.D.   On: 11/15/2022 11:47   DG  Chest Port 1 View  Result Date: 11/15/2022 CLINICAL DATA:  Provided history: Trauma.  Fall. EXAM: PORTABLE CHEST 1 VIEW COMPARISON:  Prior chest radiographs 09/05/2022 and earlier. FINDINGS: Shallow inspiration radiograph. Left chest dual-lead implantable cardiac device. Status post median sternotomy/CABG. As before, there are fractures within multiple upper sternal wires. Cardiomegaly. Aortic atherosclerosis. Ill-defined opacity at the left lung base. No appreciable airspace consolidation on the right. No definite pleural effusion. No evidence of pneumothorax. No acute osseous abnormality identified. Thoracic spondylosis. Advanced degenerative changes of the right glenohumeral joint. Degenerative changes of the left glenohumeral and bilateral acromioclavicular joints also noted. ACDF hardware. IMPRESSION: 1. Shallow inspiration radiograph 2. Ill-defined opacity at the left lung base which may reflect atelectasis and/or airspace consolidation. 3. Cardiomegaly. 4. Aortic Atherosclerosis (ICD10-I70.0). Electronically Signed   By: Jackey Loge D.O.   On: 11/15/2022 11:26   CT T-SPINE NO CHARGE  Result Date: 11/15/2022 CLINICAL DATA:  Head trauma.  Blunt injury.  Back pain. EXAM: CT Thoracic Spine with contrast TECHNIQUE: Multiplanar CT images of the thoracic spine were reconstructed from contemporary CT of the Chest. RADIATION DOSE REDUCTION: This exam was performed according to the departmental dose-optimization program which includes automated exposure control, adjustment of the mA and/or kV according to patient size and/or use of iterative reconstruction technique. CONTRAST:  No additional. COMPARISON:  None Available. FINDINGS: Alignment: Normal. Vertebrae: No acute fracture or focal pathologic process. Paraspinal and other soft tissues: Negative. Disc levels: There are moderate-to-severe multilevel degenerative changes of the thoracic spine characterized by reduced intervertebral disc height, facet  arthropathy and marked anterior marginal osteophyte formation. There is endplate irregularity and sclerosis at T8-T9 level, which is similar to the prior study and without associated surrounding soft tissue fat stranding, favoring degenerative changes. IMPRESSION: 1. No acute fracture or traumatic malalignment of the thoracic spine. 2. Moderate-to-severe multilevel degenerative changes of the thoracic spine. Electronically Signed   By: Jules Schick M.D.   On: 11/15/2022 10:48   CT HEAD WO CONTRAST  Result Date: 11/15/2022 CLINICAL DATA:  Head trauma, moderate-severe; Polytrauma,  blunt. EXAM: CT HEAD WITHOUT CONTRAST CT CERVICAL SPINE WITHOUT CONTRAST TECHNIQUE: Multidetector CT imaging of the head and cervical spine was performed following the standard protocol without intravenous contrast. Multiplanar CT image reconstructions of the cervical spine were also generated. RADIATION DOSE REDUCTION: This exam was performed according to the departmental dose-optimization program which includes automated exposure control, adjustment of the mA and/or kV according to patient size and/or use of iterative reconstruction technique. COMPARISON:  CT scan head and cervical spine from 05/16/2021. FINDINGS: CT HEAD FINDINGS Brain: No evidence of acute infarction, hemorrhage, hydrocephalus, extra-axial collection or mass lesion/mass effect. There is bilateral periventricular hypodensity, which is non-specific but most likely seen in the settings of microvascular ischemic changes. Mild in extent. Otherwise normal appearance of brain parenchyma. Cerebral volume loss with enlargement of the ventricles. Vascular: No hyperdense vessel or unexpected calcification. Intracranial arteriosclerosis. Skull: Normal. Negative for fracture or focal lesion. Old healed bilateral nasal bone fractures noted. Sinuses/Orbits: No acute finding. Other: Visualized mastoid air cells are unremarkable. No mastoid effusion. Small-to-moderate sized  extracranial scalp soft tissue swelling/hematoma seen over the left paramedian frontal region. CT CERVICAL SPINE FINDINGS Alignment: There is straightening of cervical lordosis, which may be positional. There is minimal/grade 1 anterolisthesis of C2 over C3, likely degenerative. This examination does not assess for ligamentous injury or stability. Skull base and vertebrae: No acute fracture. No primary bone lesion or focal pathologic process. Patient is status post C5 through C7 ACDF. Soft tissues and spinal canal: No prevertebral fluid or swelling. No visible canal hematoma. Disc levels: Mildly reduced C4-5 and T1-2 intervertebral disc heights. Remaining intervertebral disc heights are within normal limits. Mild-to-moderate multilevel facet arthropathy and marginal osteophyte formation. Upper chest: Negative. Other: None. IMPRESSION: 1. No acute intracranial abnormality. 2. No acute cervical spine abnormality. 3. Extracranial scalp soft tissue swelling/hematoma over the left paramedian frontal region. Electronically Signed   By: Jules Schick M.D.   On: 11/15/2022 10:37   CT CERVICAL SPINE WO CONTRAST  Result Date: 11/15/2022 CLINICAL DATA:  Head trauma, moderate-severe; Polytrauma, blunt. EXAM: CT HEAD WITHOUT CONTRAST CT CERVICAL SPINE WITHOUT CONTRAST TECHNIQUE: Multidetector CT imaging of the head and cervical spine was performed following the standard protocol without intravenous contrast. Multiplanar CT image reconstructions of the cervical spine were also generated. RADIATION DOSE REDUCTION: This exam was performed according to the departmental dose-optimization program which includes automated exposure control, adjustment of the mA and/or kV according to patient size and/or use of iterative reconstruction technique. COMPARISON:  CT scan head and cervical spine from 05/16/2021. FINDINGS: CT HEAD FINDINGS Brain: No evidence of acute infarction, hemorrhage, hydrocephalus, extra-axial collection or mass  lesion/mass effect. There is bilateral periventricular hypodensity, which is non-specific but most likely seen in the settings of microvascular ischemic changes. Mild in extent. Otherwise normal appearance of brain parenchyma. Cerebral volume loss with enlargement of the ventricles. Vascular: No hyperdense vessel or unexpected calcification. Intracranial arteriosclerosis. Skull: Normal. Negative for fracture or focal lesion. Old healed bilateral nasal bone fractures noted. Sinuses/Orbits: No acute finding. Other: Visualized mastoid air cells are unremarkable. No mastoid effusion. Small-to-moderate sized extracranial scalp soft tissue swelling/hematoma seen over the left paramedian frontal region. CT CERVICAL SPINE FINDINGS Alignment: There is straightening of cervical lordosis, which may be positional. There is minimal/grade 1 anterolisthesis of C2 over C3, likely degenerative. This examination does not assess for ligamentous injury or stability. Skull base and vertebrae: No acute fracture. No primary bone lesion or focal pathologic process. Patient is status post C5  through C7 ACDF. Soft tissues and spinal canal: No prevertebral fluid or swelling. No visible canal hematoma. Disc levels: Mildly reduced C4-5 and T1-2 intervertebral disc heights. Remaining intervertebral disc heights are within normal limits. Mild-to-moderate multilevel facet arthropathy and marginal osteophyte formation. Upper chest: Negative. Other: None. IMPRESSION: 1. No acute intracranial abnormality. 2. No acute cervical spine abnormality. 3. Extracranial scalp soft tissue swelling/hematoma over the left paramedian frontal region. Electronically Signed   By: Jules Schick M.D.   On: 11/15/2022 10:37   CT CHEST ABDOMEN PELVIS W CONTRAST  Result Date: 11/15/2022 CLINICAL DATA:  Polytrauma, blunt EXAM: CT CHEST, ABDOMEN, AND PELVIS WITH CONTRAST TECHNIQUE: Multidetector CT imaging of the chest, abdomen and pelvis was performed following the  standard protocol during bolus administration of intravenous contrast. RADIATION DOSE REDUCTION: This exam was performed according to the departmental dose-optimization program which includes automated exposure control, adjustment of the mA and/or kV according to patient size and/or use of iterative reconstruction technique. CONTRAST:  75mL OMNIPAQUE IOHEXOL 350 MG/ML SOLN COMPARISON:  CT scan chest, abdomen and pelvis from 04/10/2022. FINDINGS: CT CHEST FINDINGS Cardiovascular: Normal cardiac size. No pericardial effusion. No aortic aneurysm. There are coronary artery calcifications, in keeping with coronary artery disease. There are also mild-to-moderate peripheral atherosclerotic vascular calcifications of thoracic aorta and its major branches. Mediastinum/Nodes: Visualized thyroid gland appears grossly unremarkable. No solid / cystic mediastinal masses. The esophagus is nondistended precluding optimal assessment. No axillary, mediastinal or hilar lymphadenopathy by size criteria. Lungs/Pleura: The central tracheo-bronchial tree is patent. There are dependent changes in bilateral lungs. No mass or consolidation. No pleural effusion or pneumothorax. There is a stable sub 4 mm solid noncalcified nodule in the left lung upper lobe (series 100, image 56), unchanged since the prior study. No new suspicious lung nodules. Musculoskeletal: Patient is status post CABG. There is a left-sided dual lead cardiac pacemaker. The visualized soft tissues of the chest wall are grossly unremarkable. No suspicious osseous lesions. Multiple old healed bilateral rib fractures noted. There are moderate multilevel degenerative changes in the visualized spine. There are moderate-to-severe degenerative changes of bilateral shoulder joints with associated periarticular bursal fluid/effusion. CT ABDOMEN PELVIS FINDINGS Hepatobiliary: The liver is normal in size. Non-cirrhotic configuration. No suspicious mass. No intrahepatic or  extrahepatic bile duct dilation. Gallbladder is surgically absent. Pancreas: Unremarkable. No pancreatic ductal dilatation or surrounding inflammatory changes. Spleen: Within normal limits. No focal lesion. Adrenals/Urinary Tract: Adrenal glands are unremarkable. There is a partially exophytic, heterogeneous hyperattenuating mass arising from the right kidney upper pole, laterally measuring seven x 7 x 8 mm. This appears grossly similar to the prior study but highly concerning for solid renal neoplasm. Multiphasic contrast-enhanced MRI abdomen as per renal mass protocol is recommended in six months four confirmation and follow the growth pattern. There are multiple additional simple cysts in bilateral kidneys with largest arising from the left kidney measuring up to 6.2 x 7.7 cm. No hydronephrosis. No renal or ureteric calculi. Redemonstration of irregular hyperattenuating thickening along the base of the urinary bladder, essentially similar to the prior study. Correlation with cystoscopy is again recommended, if not previously performed. No perivesical fat stranding or bladder calculi. Stomach/Bowel: No disproportionate dilation of the small or large bowel loops. No evidence of abnormal bowel wall thickening or inflammatory changes. The appendix is unremarkable. Vascular/Lymphatic: No ascites or pneumoperitoneum. No abdominal or pelvic lymphadenopathy, by size criteria. No aneurysmal dilation of the major abdominal arteries. There are moderate peripheral atherosclerotic vascular calcifications of the aorta and  its major branches. Reproductive: Normal size prostate. Symmetric seminal vesicles. Other: There are bilateral small fat containing inguinal hernias. The soft tissues and abdominal wall are otherwise unremarkable. Musculoskeletal: No suspicious osseous lesions. There are moderate multilevel degenerative changes in the visualized spine. IMPRESSION: 1. No acute traumatic injury in the chest, abdomen or pelvis.  2. Stable sub 4 mm solid noncalcified nodule in the left lung upper lobe. 3. Stable irregular hyperattenuating thickening along the base of the urinary bladder. Correlation with cystoscopy is again recommended, if not previously performed. 4. Stable 8 mm heterogeneous hyperattenuating mass arising from the right kidney upper pole, laterally. This appears grossly similar to the prior study but highly concerning for solid renal neoplasm. Multiphasic contrast-enhanced MRI abdomen as per renal mass protocol is recommended in six months four confirmation and follow the growth pattern. Electronically Signed   By: Jules Schick M.D.   On: 11/15/2022 10:26    Procedures .Marland KitchenLaceration Repair  Date/Time: 11/15/2022 11:41 AM  Performed by: Rexford Maus, DO Authorized by: Rexford Maus, DO   Consent:    Consent obtained:  Verbal   Consent given by:  Patient   Risks, benefits, and alternatives were discussed: yes     Risks discussed:  Infection, pain, retained foreign body, need for additional repair, nerve damage, poor cosmetic result, poor wound healing, tendon damage and vascular damage   Alternatives discussed:  No treatment and delayed treatment Universal protocol:    Procedure explained and questions answered to patient or proxy's satisfaction: yes     Patient identity confirmed:  Verbally with patient Anesthesia:    Anesthesia method:  Local infiltration   Local anesthetic:  Lidocaine 1% w/o epi Laceration details:    Location:  Face   Face location:  Forehead   Length (cm):  10   Depth (mm):  3 Pre-procedure details:    Preparation:  Imaging obtained to evaluate for foreign bodies Exploration:    Limited defect created (wound extended): yes     Hemostasis achieved with:  Direct pressure   Imaging obtained comment:  CTH   Imaging outcome: foreign body not noted     Wound exploration: wound explored through full range of motion and entire depth of wound visualized     Wound  extent: areolar tissue not violated, fascia not violated, no foreign body, no signs of injury, no nerve damage, no tendon damage, no underlying fracture and no vascular damage     Contaminated: no   Treatment:    Area cleansed with:  Saline   Amount of cleaning:  Standard   Irrigation solution:  Sterile saline   Irrigation method:  Pressure wash   Visualized foreign bodies/material removed: no     Debridement:  None   Undermining:  None   Scar revision: no   Skin repair:    Repair method:  Sutures   Suture size:  6-0   Suture material:  Prolene   Suture technique:  Simple interrupted and horizontal mattress   Number of sutures:  14 Approximation:    Approximation:  Close Repair type:    Repair type:  Intermediate Post-procedure details:    Dressing:  Non-adherent dressing   Procedure completion:  Tolerated well, no immediate complications     Medications Ordered in ED Medications  lidocaine (PF) (XYLOCAINE) 1 % injection 5 mL (5 mLs Infiltration Given by Other 11/15/22 1018)  iohexol (OMNIPAQUE) 350 MG/ML injection 75 mL (75 mLs Intravenous Contrast Given 11/15/22 0958)  lidocaine (PF) (  XYLOCAINE) 1 % injection 5 mL (5 mLs Infiltration Given by Other 11/15/22 1102)    ED Course/ Medical Decision Making/ A&P Clinical Course as of 11/15/22 1315  Wed Nov 15, 2022  1107 No acute traumatic injury on CT imaging. Pacemaker interrogated with atrial arrhythmia (a fib vs flutter), ventricularly paced, functioning appropriately. C-collar cleared by myself. [VK]  1251 Possible opacity on CXR, no consolidation on CT making pneumonia unlikely. No other traumatic injuries on X-rays. Patient is stable for discharge home with outpatient follow up. [VK]    Clinical Course User Index [VK] Rexford Maus, DO                                 Medical Decision Making This patient presents to the ED with chief complaint(s) of fall with pertinent past medical history of A fib on Eliquis,  CAD, CHB s/p pacemaker which further complicates the presenting complaint. The complaint involves an extensive differential diagnosis and also carries with it a high risk of complications and morbidity.    The differential diagnosis includes ICH, mass effect, cervical or thoracic spine injury, blunt thoracic or abdominal traumatic injury, R elbow fracture/dislocation, R knee fracture or dislocation, lacerations, abrasions, possible syncope, arrhythmia, anemia, electrolyte abnormality    Additional history obtained: Additional history obtained from EMS  Records reviewed Care Everywhere/External Records - outpatient cardiology records  ED Course and Reassessment: Due to patient's fall on thinners he was made a prehospital arrival level 2 trauma.  I was immediately present at bedside.  Primary survey is intact.  On secondary survey, he does have evidence of head trauma forehead laceration as well as bruising to his right arm and regions of both knees.  Did have a syncopal fall though denied any presyncopal symptoms.  However with his extensive cardiac history will have labs and pacemaker interrogation in addition to trauma workup with CT pan scan as well as x-ray of right elbow and knee.  He declined any pain medication at this time.  Reports he is up-to-date on immunizations and does not need a tetanus update today.  Will need a laceration repair.  Independent labs interpretation:  The following labs were independently interpreted: mildly elevated lactic acid in the setting of trauma, otherwise no acute abnormality  Independent visualization of imaging: - I independently visualized the following imaging with scope of interpretation limited to determining acute life threatening conditions related to emergency care: CTH/C-spine, CTCAP, CXR, Pelvis XR, R elbow XR, R knee XR, which revealed no acute traumatic injury  Consultation: - Consulted or discussed management/test interpretation w/ external  professional: N/A  Consideration for admission or further workup: Patient has no emergent conditions requiring admission or further work-up at this time and is stable for discharge home with primary care follow-up  Social Determinants of health: N/A    Amount and/or Complexity of Data Reviewed Labs: ordered. Radiology: ordered.  Risk Prescription drug management.          Final Clinical Impression(s) / ED Diagnoses Final diagnoses:  Fall, initial encounter  Traumatic hematoma of forehead, initial encounter  Laceration of forehead, initial encounter    Rx / DC Orders ED Discharge Orders     None         Rexford Maus, DO 11/15/22 1315

## 2022-11-15 NOTE — ED Notes (Signed)
Patient transported to CT 

## 2022-11-15 NOTE — ED Notes (Signed)
Trauma Response Nurse Documentation   Mitchell Weeks is a 89 y.o. male arriving to [facility] ED via EMS  On Eliquis (apixaban) daily. Trauma was activated as a Level 2 by ED Charge RN based on the following trauma criteria Elderly patients > 65 with head trauma on anti-coagulation (excluding ASA).  Patient cleared for CT by Dr. Theresia Lo. Pt transported to CT with trauma response nurse present to monitor. RN remained with the patient throughout their absence from the department for clinical observation.   GCS 15.  History   Past Medical History:  Diagnosis Date   A-fib (HCC)    CHF (congestive heart failure) (HCC)    Hypertension    Repeated falls      Past Surgical History:  Procedure Laterality Date   PACEMAKER PLACEMENT       Initial Focused Assessment (If applicable, or please see trauma documentation): - Airway: intact, patent - Breathing: bilateral equal breath sounds - Circulation: Bleeding to laceration above left eye/forehead. Gauze applied - PERRLA - C-collar in place - 18G PIV to R AC  CT's Completed:   CT Head, CT C-Spine, CT Chest w/ contrast, and CT abdomen/pelvis w/ contrast   Interventions:  - Trauma labs - CXR - Pelvic XR - CT pan scan   Plan for disposition:  Other   Consults completed:  none at 0945.  Event Summary: FOT. Pt states he hit his head on concrete and had possible brief LOC.  On Eliquis. C/O HA, neck pain and L back pain.  Bedside handoff with ED RN Demetria Pore, Durwin Nora  Trauma Response RN  Please call TRN at [phone] for further assistance.

## 2023-07-01 DEATH — deceased

## 2023-07-30 ENCOUNTER — Ambulatory Visit: Admitting: Diagnostic Neuroimaging
